# Patient Record
Sex: Female | Born: 2000 | Race: White | Hispanic: No | Marital: Single | Smoking: Former smoker
Health system: Southern US, Community
[De-identification: ages and names within clinical notes are randomized; demographics above are authoritative.]

## PROBLEM LIST (undated history)

## (undated) ENCOUNTER — Inpatient Hospital Stay (HOSPITAL_COMMUNITY): Payer: Self-pay

## (undated) ENCOUNTER — Inpatient Hospital Stay: Payer: Self-pay

## (undated) DIAGNOSIS — Z789 Other specified health status: Secondary | ICD-10-CM

## (undated) HISTORY — DX: Other specified health status: Z78.9

---
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
## 2020-02-18 ENCOUNTER — Other Ambulatory Visit: Payer: Self-pay

## 2020-02-18 DIAGNOSIS — J029 Acute pharyngitis, unspecified: Secondary | ICD-10-CM | POA: Insufficient documentation

## 2020-02-18 LAB — CBC WITH DIFFERENTIAL/PLATELET
Abs Immature Granulocytes: 0.03 10*3/uL (ref 0.00–0.07)
Basophils Absolute: 0 10*3/uL (ref 0.0–0.1)
Basophils Relative: 0 %
Eosinophils Absolute: 0.1 10*3/uL (ref 0.0–0.5)
Eosinophils Relative: 1 %
HCT: 36.3 % (ref 36.0–46.0)
Hemoglobin: 12.1 g/dL (ref 12.0–15.0)
Immature Granulocytes: 0 %
Lymphocytes Relative: 24 %
Lymphs Abs: 2.3 10*3/uL (ref 0.7–4.0)
MCH: 28 pg (ref 26.0–34.0)
MCHC: 33.3 g/dL (ref 30.0–36.0)
MCV: 84 fL (ref 80.0–100.0)
Monocytes Absolute: 0.7 10*3/uL (ref 0.1–1.0)
Monocytes Relative: 7 %
Neutro Abs: 6.5 10*3/uL (ref 1.7–7.7)
Neutrophils Relative %: 68 %
Platelets: 271 10*3/uL (ref 150–400)
RBC: 4.32 MIL/uL (ref 3.87–5.11)
RDW: 12.8 % (ref 11.5–15.5)
WBC: 9.7 10*3/uL (ref 4.0–10.5)
nRBC: 0 % (ref 0.0–0.2)

## 2020-02-18 LAB — BASIC METABOLIC PANEL
Anion gap: 12 (ref 5–15)
BUN: 10 mg/dL (ref 6–20)
CO2: 22 mmol/L (ref 22–32)
Calcium: 9.2 mg/dL (ref 8.9–10.3)
Chloride: 103 mmol/L (ref 98–111)
Creatinine, Ser: 0.7 mg/dL (ref 0.44–1.00)
GFR calc Af Amer: >60 mL/min (ref >60)
GFR calc non Af Amer: >60 mL/min (ref >60)
Glucose, Bld: 95 mg/dL (ref 70–99)
Potassium: 3.4 mmol/L — ABNORMAL LOW (ref 3.5–5.1)
Sodium: 137 mmol/L (ref 135–145)

## 2020-02-18 LAB — GROUP A STREP BY PCR: Group A Strep by PCR: NOT DETECTED

## 2020-02-18 NOTE — ED Triage Notes (Addendum)
Pt states she has a whole in her tonsil. Pt denies pain. Large what appears to be abscess with area of ulceration and sloughing noted to right tonsil. No airway compromise noted.

## 2020-02-19 ENCOUNTER — Emergency Department
Admission: EM | Admit: 2020-02-19 | Discharge: 2020-02-19 | Disposition: A | Discharge status: Home / Self Care | Payer: Self-pay | Attending: Emergency Medicine | Admitting: Emergency Medicine

## 2020-02-19 DIAGNOSIS — J029 Acute pharyngitis, unspecified: Secondary | ICD-10-CM

## 2020-02-19 MED ORDER — AMOXICILLIN 875 MG PO TABS: 875.0000 mg | ORAL_TABLET | Freq: Two times a day (BID) | ORAL | 0 refills | Status: DC

## 2020-02-19 NOTE — ED Provider Notes (Signed)
[facility] Emergency Department Provider Note  ____________________________________________   None    (approximate)  I have reviewed the triage vital signs and the nursing notes.   HISTORY  Chief Complaint Sore Throat   HPI Heather Newton is a 19 y.o. female presents to the ED with complaint of sore throat for several days.  Patient denies any fever or chills.  Patient reports that she is seen what she believes to be an ulcer/hole in her right tonsil.  Patient reports that she has had multiple strep infections in the past.  Currently she rates her pain as a 0/10.       No past medical history on file.  There are no problems to display for this patient.   No past surgical history on file.  Prior to Admission medications   Medication Sig Start Date End Date Taking? Authorizing Provider  amoxicillin (AMOXIL) 875 MG tablet Take 1 tablet (875 mg total) by mouth 2 (two) times daily. 02/19/20   Tommi Rumps, PA-C  dicyclomine (BENTYL) 10 MG capsule Take 1 capsule (10 mg total) by mouth 3 (three) times daily between meals as needed for spasms (abdominal pain). 08/08/16 08/22/16  Myrna Blazer, MD  sucralfate (CARAFATE) 1 g tablet Take 1 tablet (1 g total) by mouth 3 (three) times daily as needed. 07/11/18 07/11/19  Willy Eddy, MD    Allergies Patient has no known allergies.  No family history on file.  Social History Social History   Tobacco Use   Smoking status: Never Smoker   Smokeless tobacco: Never Used  Substance Use Topics   Alcohol use: No   Drug use: Not on file    Review of Systems Constitutional: No fever/chills Eyes: No visual changes. ENT: Positive sore throat.  Negative for changes in taste or smell. Cardiovascular: Denies chest pain. Respiratory: Denies shortness of breath. Gastrointestinal:   No nausea, no vomiting.  No diarrhea. Musculoskeletal: Negative for muscle aches. Skin: Negative for  rash. Neurological: Negative for headaches, focal weakness or numbness. ____________________________________________   PHYSICAL EXAM:  VITAL SIGNS: ED Triage Vitals  Enc Vitals Group     BP 02/18/20 2001 (!) 127/92     Pulse Rate 02/18/20 2001 (!) 103     Resp 02/18/20 2001 16     Temp 02/18/20 2001 99 F (37.2 C)     Temp Source 02/18/20 2001 Oral     SpO2 02/18/20 2001 100 %     Weight 02/18/20 2002 234 lb (106.1 kg)     Height 02/18/20 2002 5\' 9"  (1.753 m)     Head Circumference --      Peak Flow --      Pain Score 02/18/20 2002 0     Pain Loc --      Pain Edu? --      Excl. in GC? --     Constitutional: Alert and oriented. Well appearing and in no acute distress. Eyes: Conjunctivae are normal. PERRL. EOMI. Head: Atraumatic. Nose: No congestion/rhinnorhea. Mouth/Throat: Mucous membranes are moist.  Oropharynx minimal erythema without exudate.  Tonsils appear cryptic on the right with some mild exudate.  Uvula is midline. Neck: No stridor.   Cardiovascular: Normal rate, regular rhythm. Grossly normal heart sounds.  Good peripheral circulation. Respiratory: Normal respiratory effort.  No retractions. Lungs CTAB. Musculoskeletal: Moves upper and lower extremities with any difficulty.  Normal gait was noted. Neurologic:  Normal speech and language. No gross focal neurologic deficits are appreciated.  Skin:  Skin is warm, dry and intact. No rash noted. Psychiatric: Mood and affect are normal. Speech and behavior are normal.  ____________________________________________   LABS (all labs ordered are listed, but only abnormal results are displayed)  Labs Reviewed  BASIC METABOLIC PANEL - Abnormal; Notable for the following components:      Result Value   Potassium 3.4 (*)    All other components within normal limits  GROUP A STREP BY PCR  CBC WITH DIFFERENTIAL/PLATELET     PROCEDURES  Procedure(s) performed (including Critical  Care):  Procedures   ____________________________________________   INITIAL IMPRESSION / ASSESSMENT AND PLAN / ED COURSE  As part of my medical decision making, I reviewed the following data within the electronic MEDICAL RECORD NUMBER Notes from prior ED visits and [place] Controlled Substance Database  Heather Newton was evaluated in Emergency Department on 02/19/2020 for the symptoms described in the history of present illness. She was evaluated in the context of the global COVID-19 pandemic, which necessitated consideration that the patient might be at risk for infection with the SARS-CoV-2 virus that causes COVID-19. Institutional protocols and algorithms that pertain to the evaluation of patients at risk for COVID-19 are in a state of rapid change based on information released by regulatory bodies including the CDC and federal and state organizations. These policies and algorithms were followed during the patient's care in the ED.  19 year old female presents to the ED with complaint of sore throat for several days without history of fever or chills.  Patient reports that she has had strep throat multiple times.  Strep test in the ED today is negative.  Lab work is essentially within normal limits with the exception of some mild hyponatremia as patient's potassium is 3.4.  This was discussed with her and she will try to increase foods that are rich in potassium.  She was made aware that she does not need to take medication for this.  She is to follow-up with Dr. Willeen Cass who is on-call for ENT as she wishes to get her tonsils removed in the future.  A prescription for amoxicillin 875 twice daily for 10 days was sent to her pharmacy.  She is instructed to take Tylenol or ibuprofen as needed for throat pain. ____________________________________________   FINAL CLINICAL IMPRESSION(S) / ED DIAGNOSES  Final diagnoses:  Acute pharyngitis, unspecified etiology     ED Discharge Orders         Ordered     amoxicillin (AMOXIL) 875 MG tablet  2 times daily        02/19/20 0254           Note:  This document was prepared using Dragon voice recognition software and may include unintentional dictation errors.    Tommi Rumps, PA-C 02/19/20 2706    Sharyn Creamer, MD 02/19/20 1015

## 2020-02-19 NOTE — Discharge Instructions (Signed)
Follow up Dr. Willeen Cass if any continued problems or concerns.  Take amoxil until completely finished.  Tylenol or ibuprofen if needed for pain or fever.  Increase fluids.

## 2021-12-09 ENCOUNTER — Other Ambulatory Visit: Payer: Self-pay

## 2021-12-09 ENCOUNTER — Emergency Department
Admission: EM | Admit: 2021-12-09 | Discharge: 2021-12-09 | Disposition: A | Discharge status: Home / Self Care | Payer: Self-pay | Attending: Student in an Organized Health Care Education/Training Program | Admitting: Student in an Organized Health Care Education/Training Program

## 2021-12-09 DIAGNOSIS — R1011 Right upper quadrant pain: Secondary | ICD-10-CM | POA: Insufficient documentation

## 2021-12-09 DIAGNOSIS — M545 Low back pain, unspecified: Secondary | ICD-10-CM | POA: Insufficient documentation

## 2021-12-09 DIAGNOSIS — N912 Amenorrhea, unspecified: Secondary | ICD-10-CM | POA: Insufficient documentation

## 2021-12-09 DIAGNOSIS — R1031 Right lower quadrant pain: Secondary | ICD-10-CM | POA: Insufficient documentation

## 2021-12-09 LAB — COMPREHENSIVE METABOLIC PANEL
ALT: 16 U/L (ref 0–44)
AST: 14 U/L — ABNORMAL LOW (ref 15–41)
Albumin: 3.8 g/dL (ref 3.5–5.0)
Alkaline Phosphatase: 49 U/L (ref 38–126)
Anion gap: 8 (ref 5–15)
BUN: 10 mg/dL (ref 6–20)
CO2: 23 mmol/L (ref 22–32)
Calcium: 9.5 mg/dL (ref 8.9–10.3)
Chloride: 107 mmol/L (ref 98–111)
Creatinine, Ser: 0.74 mg/dL (ref 0.44–1.00)
GFR, Estimated: >60 mL/min (ref >60)
Glucose, Bld: 105 mg/dL — ABNORMAL HIGH (ref 70–99)
Potassium: 3.8 mmol/L (ref 3.5–5.1)
Sodium: 138 mmol/L (ref 135–145)
Total Bilirubin: 0.2 mg/dL — ABNORMAL LOW (ref 0.3–1.2)
Total Protein: 8.3 g/dL — ABNORMAL HIGH (ref 6.5–8.1)

## 2021-12-09 LAB — LIPASE, BLOOD: Lipase: 32 U/L (ref 11–51)

## 2021-12-09 LAB — URINALYSIS, ROUTINE W REFLEX MICROSCOPIC
Bilirubin Urine: NEGATIVE
Glucose, UA: NEGATIVE mg/dL
Hgb urine dipstick: NEGATIVE
Ketones, ur: NEGATIVE mg/dL
Leukocytes,Ua: NEGATIVE
Nitrite: NEGATIVE
Protein, ur: NEGATIVE mg/dL
Specific Gravity, Urine: 1.03 (ref 1.005–1.030)
pH: 5 (ref 5.0–8.0)

## 2021-12-09 LAB — CBC
HCT: 40.3 % (ref 36.0–46.0)
Hemoglobin: 12.8 g/dL (ref 12.0–15.0)
MCH: 26.9 pg (ref 26.0–34.0)
MCHC: 31.8 g/dL (ref 30.0–36.0)
MCV: 84.7 fL (ref 80.0–100.0)
Platelets: 315 10*3/uL (ref 150–400)
RBC: 4.76 MIL/uL (ref 3.87–5.11)
RDW: 13.6 % (ref 11.5–15.5)
WBC: 6.7 10*3/uL (ref 4.0–10.5)
nRBC: 0 % (ref 0.0–0.2)

## 2021-12-09 LAB — POC URINE PREG, ED: Preg Test, Ur: NEGATIVE

## 2021-12-09 NOTE — Discharge Instructions (Signed)
Please call GYN physician tomorrow to schedule follow-up appointment.  Return to the ER for any increasing pain, fevers, nausea, vomiting or any urgent changes in her health.  You may alternate Tylenol 1000 mg along with ibuprofen 800 mg every 6 hours as needed for pain.

## 2021-12-09 NOTE — ED Provider Notes (Cosign Needed)
[facility] EMERGENCY DEPARTMENT Provider Note   CSN: [id number] Arrival date & time: 12/09/21  1436     History  Chief Complaint  Patient presents with   Abdominal Pain    Heather Newton is a 21 y.o. female with no known past medical history or surgical history presents to the emergency department for evaluation of 3 weeks of right upper and lower abdominal pain with right lower back pain.  Pain is sharp and lasts 10 minutes.  Symptoms come and go with no known exacerbating or improving factors.  She has not had any medications for pain.  Patient states she has not had a menstrual period in 4 months.  She denies any birth control, history of amenorrhea.  No nausea, vomiting, fevers, urinary symptoms or vaginal bleeding.  HPI     Home Medications Prior to Admission medications   Medication Sig Start Date End Date Taking? Authorizing Provider  amoxicillin (AMOXIL) 875 MG tablet Take 1 tablet (875 mg total) by mouth 2 (two) times daily. 02/19/20   Tommi Rumps, PA-C  dicyclomine (BENTYL) 10 MG capsule Take 1 capsule (10 mg total) by mouth 3 (three) times daily between meals as needed for spasms (abdominal pain). 08/08/16 08/22/16  Myrna Blazer, MD  sucralfate (CARAFATE) 1 g tablet Take 1 tablet (1 g total) by mouth 3 (three) times daily as needed. 07/11/18 07/11/19  Willy Eddy, MD      Allergies    Patient has no known allergies.    Review of Systems   Review of Systems  Physical Exam Updated Vital Signs BP 120/67 (BP Location: Left Arm)   Pulse 80   Temp 99 F (37.2 C) (Oral)   Resp 20   Ht 5\' 9"  (1.753 m)   Wt 122.5 kg   SpO2 97%   BMI 39.87 kg/m  Physical Exam Constitutional:      General: She is not in acute distress.    Appearance: She is well-developed. She is obese. She is not toxic-appearing.  HENT:     Head: Normocephalic and atraumatic.  Eyes:     Conjunctiva/sclera: Conjunctivae normal.  Cardiovascular:     Rate  and Rhythm: Normal rate and regular rhythm.     Pulses: Normal pulses.     Heart sounds: Normal heart sounds.  Pulmonary:     Effort: Pulmonary effort is normal. No respiratory distress.  Abdominal:     General: Bowel sounds are normal.     Palpations: Abdomen is soft. There is no mass.     Tenderness: There is abdominal tenderness (RUQ tenderness, mild.  Mild right lower quadrant tenderness). There is right CVA tenderness. There is no left CVA tenderness or rebound.  Musculoskeletal:        General: Normal range of motion.     Cervical back: Normal range of motion.  Skin:    General: Skin is warm.     Findings: No rash.  Neurological:     General: No focal deficit present.     Mental Status: She is alert and oriented to person, place, and time. Mental status is at baseline.  Psychiatric:        Behavior: Behavior normal.        Thought Content: Thought content normal.     ED Results / Procedures / Treatments   Labs (all labs ordered are listed, but only abnormal results are displayed) Labs Reviewed  COMPREHENSIVE METABOLIC PANEL - Abnormal; Notable for the following components:  Result Value   Glucose, Bld 105 (*)    Total Protein 8.3 (*)    AST 14 (*)    Total Bilirubin 0.2 (*)    All other components within normal limits  URINALYSIS, ROUTINE W REFLEX MICROSCOPIC - Abnormal; Notable for the following components:   Color, Urine YELLOW (*)    APPearance HAZY (*)    All other components within normal limits  LIPASE, BLOOD  CBC  POC URINE PREG, ED    EKG None  Radiology No results found.  Procedures Procedures    Medications Ordered in ED Medications - No data to display  ED Course/ Medical Decision Making/ A&P                           Medical Decision Making Amount and/or Complexity of Data Reviewed Labs: ordered.   21 year old female with secondary amenorrhea, without menstrual period x4 months.  Having some intermittent pain x3 weeks along the  right lower and right upper quadrant but only last about 10 minutes not increased with eating, no nausea vomiting diarrhea or fevers.  She has had a negative pregnancy test.  CBC and CMP within normal limits showing no signs of any hepatobiliary disease or infection.  Urinalysis negative for any infectious process.  Patient appears well, comfortable in no distress.  Her vital signs are stable.  Recommend patient follow-up with GYN physician for work-up for her amenorrhea.    Final Clinical Impression(s) / ED Diagnoses Final diagnoses:  Right upper quadrant abdominal pain  Acute right-sided low back pain without sciatica  Amenorrhea    Rx / DC Orders ED Discharge Orders     None         Evon Slack, PA-C 12/09/21 1649

## 2021-12-09 NOTE — ED Triage Notes (Signed)
Pt c/o intermittent RLQ pain radiating into the back for the past 3 weeks, states she has not had a period in the past 4 months and has had multiple negative pregnancy test.

## 2021-12-14 ENCOUNTER — Encounter: Payer: Self-pay | Admitting: Obstetrics and Gynecology

## 2021-12-23 ENCOUNTER — Encounter: Payer: Self-pay | Admitting: Obstetrics & Gynecology

## 2022-02-16 ENCOUNTER — Encounter: Payer: Self-pay | Admitting: Emergency Medicine

## 2022-02-16 ENCOUNTER — Emergency Department
Admission: EM | Admit: 2022-02-16 | Discharge: 2022-02-16 | Disposition: A | Discharge status: Home / Self Care | Payer: Self-pay | Attending: Emergency Medicine | Admitting: Emergency Medicine

## 2022-02-16 ENCOUNTER — Other Ambulatory Visit: Payer: Self-pay

## 2022-02-16 DIAGNOSIS — Z0279 Encounter for issue of other medical certificate: Secondary | ICD-10-CM | POA: Insufficient documentation

## 2022-02-16 DIAGNOSIS — Z0289 Encounter for other administrative examinations: Secondary | ICD-10-CM

## 2022-02-16 NOTE — ED Triage Notes (Signed)
Pt states that she missed work due to her menstrual cycle and needs a work note. Pt denies any complaints at this time.

## 2022-02-16 NOTE — ED Notes (Signed)
Dc instructions and work note provided to pt. No questions at this time.

## 2022-02-16 NOTE — ED Provider Notes (Signed)
   [facility] Provider Note    Event Date/Time   First MD Initiated Contact with Patient 02/16/22 1401     (approximate)   History   work note   HPI  Heather Newton is a 21 y.o. female presents to the emergency department to receive a note that states that she is able to return to work.  She missed work today because she started her menstrual cycle and had a significant amount of pain.  This pain has resolved and she denies medical complaints at this time.  History reviewed. No pertinent past medical history.   Physical Exam   Triage Vital Signs: ED Triage Vitals  Enc Vitals Group     BP 02/16/22 1341 124/74     Pulse Rate 02/16/22 1341 83     Resp 02/16/22 1341 16     Temp 02/16/22 1341 97.7 F (36.5 C)     Temp Source 02/16/22 1341 Oral     SpO2 02/16/22 1341 99 %     Weight --      Height --      Head Circumference --      Peak Flow --      Pain Score 02/16/22 1335 0     Pain Loc --      Pain Edu? --      Excl. in GC? --     Most recent vital signs: Vitals:   02/16/22 1341  BP: 124/74  Pulse: 83  Resp: 16  Temp: 97.7 F (36.5 C)  SpO2: 99%    General: Awake, no distress.  CV:  Good peripheral perfusion.  Resp:  Normal effort.  Abd:  No distention.  Other:    ED Results / Procedures / Treatments   Labs (all labs ordered are listed, but only abnormal results are displayed) Labs Reviewed - No data to display   EKG     RADIOLOGY    I have independently reviewed and interpreted imaging as well as reviewed report from radiology.  PROCEDURES:  Critical Care performed: No  Procedures   MEDICATIONS ORDERED IN ED:  Medications - No data to display   IMPRESSION / MDM / ASSESSMENT AND PLAN / ED COURSE   I reviewed the triage vital signs and the nursing notes.  Differential diagnosis includes, but is not limited to: n/a  Patient's presentation is most consistent with acute, uncomplicated  illness.  21 year old female presenting to the emergency department requesting a work excuse.  She has no current medical complaints.  See HPI.      FINAL CLINICAL IMPRESSION(S) / ED DIAGNOSES   Final diagnoses:  Encounter to obtain excuse from work     Rx / DC Orders   ED Discharge Orders     None        Note:  This document was prepared using Conservation officer, historic buildings and may include unintentional dictation errors.   Chinita Pester, FNP 02/16/22 1407    Georga Hacking, MD 02/16/22 [phone]

## 2022-03-13 DIAGNOSIS — H538 Other visual disturbances: Secondary | ICD-10-CM | POA: Insufficient documentation

## 2022-03-13 DIAGNOSIS — R519 Headache, unspecified: Secondary | ICD-10-CM | POA: Insufficient documentation

## 2022-03-13 DIAGNOSIS — H02401 Unspecified ptosis of right eyelid: Secondary | ICD-10-CM | POA: Insufficient documentation

## 2022-03-13 NOTE — ED Triage Notes (Addendum)
Pt states "there is something wrong with my eye". Pt states has been present for five days. Pt states right eye "white vision" tonight before placing eye drops in eye.Marland Kitchen Pt with slight ptosis noted to right eye. Pt states now has blurred vision in right eye. Speech clear. Pt also complains of pain in right eye.

## 2022-03-14 ENCOUNTER — Other Ambulatory Visit: Payer: Self-pay

## 2022-03-14 ENCOUNTER — Encounter: Payer: Self-pay | Admitting: Radiology

## 2022-03-14 ENCOUNTER — Emergency Department: Payer: Self-pay

## 2022-03-14 ENCOUNTER — Emergency Department
Admission: EM | Admit: 2022-03-14 | Discharge: 2022-03-14 | Disposition: A | Discharge status: Home / Self Care | Payer: Self-pay | Attending: Emergency Medicine | Admitting: Emergency Medicine

## 2022-03-14 DIAGNOSIS — H538 Other visual disturbances: Secondary | ICD-10-CM

## 2022-03-14 DIAGNOSIS — H02401 Unspecified ptosis of right eyelid: Secondary | ICD-10-CM

## 2022-03-14 DIAGNOSIS — R519 Headache, unspecified: Secondary | ICD-10-CM

## 2022-03-14 LAB — BASIC METABOLIC PANEL
Anion gap: 4 — ABNORMAL LOW (ref 5–15)
BUN: 11 mg/dL (ref 6–20)
CO2: 27 mmol/L (ref 22–32)
Calcium: 9.3 mg/dL (ref 8.9–10.3)
Chloride: 108 mmol/L (ref 98–111)
Creatinine, Ser: 0.77 mg/dL (ref 0.44–1.00)
GFR, Estimated: >60 mL/min (ref >60)
Glucose, Bld: 114 mg/dL — ABNORMAL HIGH (ref 70–99)
Potassium: 3.5 mmol/L (ref 3.5–5.1)
Sodium: 139 mmol/L (ref 135–145)

## 2022-03-14 LAB — CBC WITH DIFFERENTIAL/PLATELET
Abs Immature Granulocytes: 0.03 10*3/uL (ref 0.00–0.07)
Basophils Absolute: 0 10*3/uL (ref 0.0–0.1)
Basophils Relative: 0 %
Eosinophils Absolute: 0.2 10*3/uL (ref 0.0–0.5)
Eosinophils Relative: 2 %
HCT: 37.9 % (ref 36.0–46.0)
Hemoglobin: 12.1 g/dL (ref 12.0–15.0)
Immature Granulocytes: 0 %
Lymphocytes Relative: 34 %
Lymphs Abs: 3.3 10*3/uL (ref 0.7–4.0)
MCH: 27.1 pg (ref 26.0–34.0)
MCHC: 31.9 g/dL (ref 30.0–36.0)
MCV: 84.8 fL (ref 80.0–100.0)
Monocytes Absolute: 0.9 10*3/uL (ref 0.1–1.0)
Monocytes Relative: 9 %
Neutro Abs: 5.1 10*3/uL (ref 1.7–7.7)
Neutrophils Relative %: 55 %
Platelets: 316 10*3/uL (ref 150–400)
RBC: 4.47 MIL/uL (ref 3.87–5.11)
RDW: 13.1 % (ref 11.5–15.5)
WBC: 9.5 10*3/uL (ref 4.0–10.5)
nRBC: 0 % (ref 0.0–0.2)

## 2022-03-14 LAB — HCG, QUANTITATIVE, PREGNANCY: hCG, Beta Chain, Quant, S: <1 m[IU]/mL (ref <5)

## 2022-03-14 MED ORDER — GADOPICLENOL 0.5 MMOL/ML IV SOLN
5.0000 mL | Freq: Once | INTRAVENOUS | Status: AC | PRN
  Administered 2022-03-14: 5 mL via INTRAVENOUS

## 2022-03-14 NOTE — Discharge Instructions (Addendum)
Return to the ER for recurrent or worsening symptoms, persistent vomiting, difficulty breathing or other concerns. 

## 2022-03-14 NOTE — ED Notes (Signed)
NAD noted at time of D/C. Pt denies questions or concerns. Pt ambulatory to the lobby at this time. Verbal consent for D/C obtained at this time.  

## 2022-03-14 NOTE — ED Provider Notes (Signed)
[facility] Provider Note    Event Date/Time   First MD Initiated Contact with Patient 03/14/22 0005     (approximate)   History   Eye Problem   HPI  Heather Newton is a 21 y.o. female who presents to the ED from home with a chief complaint of right eye problem.  Patient does not wear corrective lenses or contact lenses.  Reports symptoms ongoing for 5 days.  Reports "white blurry vision" to right eye.  Denies floaters or foreign body sensation.  Denies photophobia.  Placed some over-the-counter eyedrops "for infection" prior to arrival.  Presents tonight because her father told her to come although she has noted progressive worsening of her symptoms.  Also endorses headache.  No history of migraines. Denies trauma/injury/foreign body sensation. Denies tearing from eye. Denies facial droop, drooling, abnormal sensation, slurred speech, extremity weakness/numbness/tingling.     Past Medical History  No past medical history on file.   Active Problem List  There are no problems to display for this patient.    Past Surgical History  No past surgical history on file.   Home Medications   Prior to Admission medications   Medication Sig Start Date End Date Taking? Authorizing Provider  amoxicillin (AMOXIL) 875 MG tablet Take 1 tablet (875 mg total) by mouth 2 (two) times daily. 02/19/20   Tommi Rumps, PA-C  dicyclomine (BENTYL) 10 MG capsule Take 1 capsule (10 mg total) by mouth 3 (three) times daily between meals as needed for spasms (abdominal pain). 08/08/16 08/22/16  Myrna Blazer, MD  sucralfate (CARAFATE) 1 g tablet Take 1 tablet (1 g total) by mouth 3 (three) times daily as needed. 07/11/18 07/11/19  Willy Eddy, MD     Allergies  Patient has no known allergies.   Family History  No FH of MS   Physical Exam  Triage Vital Signs: ED Triage Vitals  Enc Vitals Group     BP 03/14/22 0002 (!) 114/58     Pulse Rate 03/14/22  0002 96     Resp 03/14/22 0002 20     Temp 03/14/22 0002 98.6 F (37 C)     Temp Source 03/14/22 0002 Oral     SpO2 03/14/22 0002 98 %     Weight 03/14/22 0001 243 lb (110.2 kg)     Height 03/14/22 0001 5\' 9"  (1.753 m)     Head Circumference --      Peak Flow --      Pain Score 03/14/22 0000 8     Pain Loc --      Pain Edu? --      Excl. in [place]? --     Updated Vital Signs: BP 122/67 (BP Location: Right Arm)   Pulse 88   Temp 98.6 F (37 C) (Oral)   Resp 18   Ht 5\' 9"  (1.753 m)   Wt 110.2 kg   LMP 03/07/2022   SpO2 99%   BMI 35.88 kg/m    General: Awake, no distress.  CV:  Good peripheral perfusion.  Resp:  Normal effort.  Abd:  No distention.  Other:  Right ptosis noted. Right eyelid slightly weaker than left.  PERRL.  EOMI with pain.  No miosis noted.  Funduscopy grossly within normal limits.  No papilledema.  No photophobia.  No carotid bruits.  Supple neck without meningismus.  Alert and oriented x3.  CN II-XII except for CN III grossly intact.  5/5 motor strength and sensation  all extremities. MAEx4.   ED Results / Procedures / Treatments  Labs (all labs ordered are listed, but only abnormal results are displayed) Labs Reviewed  BASIC METABOLIC PANEL - Abnormal; Notable for the following components:      Result Value   Glucose, Bld 114 (*)    Anion gap 4 (*)    All other components within normal limits  CBC WITH DIFFERENTIAL/PLATELET  HCG, QUANTITATIVE, PREGNANCY     EKG  None   RADIOLOGY I have independently visualized and interpreted patient's MRI/MRA as well as noted the radiology interpretation:  MRI brain: Normal  MRA: Normal  Official radiology report(s): MR Brain W and Wo Contrast  Result Date: 03/14/2022 CLINICAL DATA:  Initial evaluation for acute headache, right sided ptosis. EXAM: MRI HEAD WITHOUT AND WITH CONTRAST MRA HEAD WITHOUT CONTRAST TECHNIQUE: Multiplanar, multi-echo pulse sequences of the brain and surrounding structures were  acquired without and with intravenous contrast. Angiographic images of the Circle of Willis were acquired using MRA technique without intravenous contrast. CONTRAST:  5 cc of Vueway COMPARISON:  None Available. FINDINGS: MRI HEAD FINDINGS Brain: Cerebral volume within normal limits for age. No focal parenchymal signal abnormality. No abnormal foci of restricted diffusion to suggest acute or subacute ischemia. Gray-white matter differentiation well maintained. No encephalomalacia to suggest chronic cortical infarction or other insult. No foci of susceptibility artifact indicative of acute or chronic intracranial blood products. No mass lesion, midline shift or mass effect. Ventricles normal in size and morphology without hydrocephalus. No extra-axial fluid collection. Pituitary gland and suprasellar region within normal limits. No abnormal enhancement. 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 Vascular: Major intracranial vascular flow voids are well maintained. Skull and upper cervical spine: Craniocervical junction within normal limits. Visualized upper cervical spine demonstrates no significant finding. Bone marrow signal intensity within normal limits. No scalp soft tissue abnormality. Sinuses/Orbits: Globes and orbital soft tissues are within normal limits. Paranasal sinuses are largely clear. No significant mastoid effusion. Other: None. MRA HEAD FINDINGS Anterior circulation: Visualized distal cervical segments of the internal carotid arteries are widely patent with antegrade flow. Petrous, cavernous, and supraclinoid segments widely patent without stenosis or other abnormality. A1 segments patent bilaterally. Normal anterior communicating artery complex. Anterior cerebral arteries widely patent without stenosis. No M1 stenosis or occlusion. Normal MCA bifurcations. No proximal MCA branch occlusion or stenosis. Distal MCA branches perfused and symmetric. Posterior circulation:  Vertebral arteries are code dominant and widely patent to the vertebrobasilar junction. Both PICA patent. Basilar widely patent to its distal aspect without stenosis. Superior cerebellar arteries patent bilaterally. Right PCA primarily supplied via the basilar, although a small right posterior communicating artery is noted. Dominant fetal type origin of the left PCA. Both PCAs widely patent to their distal aspects without stenosis. Anatomic variants: Fetal type origin of the left PCA. No intracranial aneurysm or other vascular malformation. IMPRESSION: 1. Normal brain MRI, with and without contrast. No acute intracranial abnormality or findings to explain patient's symptoms. 2. Normal intracranial MRA. No intracranial aneurysm or other vascular abnormality. Electronically Signed   By: Jeannine Boga M.D.   On: 03/14/2022 03:01   MR ANGIO HEAD WO CONTRAST  Result Date: 03/14/2022 CLINICAL DATA:  Initial evaluation for acute headache, right sided ptosis. EXAM: MRI HEAD WITHOUT AND WITH CONTRAST MRA HEAD WITHOUT CONTRAST TECHNIQUE: Multiplanar, multi-echo pulse sequences of the brain and surrounding structures were acquired without and with intravenous contrast. Angiographic images of the Circle of Willis  were acquired using MRA technique without intravenous contrast. CONTRAST:  5 cc of Vueway COMPARISON:  None Available. FINDINGS: MRI HEAD FINDINGS Brain: Cerebral volume within normal limits for age. No focal parenchymal signal abnormality. No abnormal foci of restricted diffusion to suggest acute or subacute ischemia. Gray-white matter differentiation well maintained. No encephalomalacia to suggest chronic cortical infarction or other insult. No foci of susceptibility artifact indicative of acute or chronic intracranial blood products. No mass lesion, midline shift or mass effect. Ventricles normal in size and morphology without hydrocephalus. No extra-axial fluid collection. Pituitary gland and suprasellar  region within normal limits. No abnormal enhancement. 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 Vascular: Major intracranial vascular flow voids are well maintained. Skull and upper cervical spine: Craniocervical junction within normal limits. Visualized upper cervical spine demonstrates no significant finding. Bone marrow signal intensity within normal limits. No scalp soft tissue abnormality. Sinuses/Orbits: Globes and orbital soft tissues are within normal limits. Paranasal sinuses are largely clear. No significant mastoid effusion. Other: None. MRA HEAD FINDINGS Anterior circulation: Visualized distal cervical segments of the internal carotid arteries are widely patent with antegrade flow. Petrous, cavernous, and supraclinoid segments widely patent without stenosis or other abnormality. A1 segments patent bilaterally. Normal anterior communicating artery complex. Anterior cerebral arteries widely patent without stenosis. No M1 stenosis or occlusion. Normal MCA bifurcations. No proximal MCA branch occlusion or stenosis. Distal MCA branches perfused and symmetric. Posterior circulation: Vertebral arteries are code dominant and widely patent to the vertebrobasilar junction. Both PICA patent. Basilar widely patent to its distal aspect without stenosis. Superior cerebellar arteries patent bilaterally. Right PCA primarily supplied via the basilar, although a small right posterior communicating artery is noted. Dominant fetal type origin of the left PCA. Both PCAs widely patent to their distal aspects without stenosis. Anatomic variants: Fetal type origin of the left PCA. No intracranial aneurysm or other vascular malformation. IMPRESSION: 1. Normal brain MRI, with and without contrast. No acute intracranial abnormality or findings to explain patient's symptoms. 2. Normal intracranial MRA. No intracranial aneurysm or other vascular abnormality. Electronically Signed   By: Rise Mu M.D.   On: 03/14/2022 03:01     PROCEDURES:  Critical Care performed: No  Procedures   MEDICATIONS ORDERED IN ED: Medications  gadopiclenol (VUEWAY) 0.5 MMOL/ML solution 5 mL (5 mLs Intravenous Contrast Given 03/14/22 0129)     IMPRESSION / MDM / ASSESSMENT AND PLAN / ED COURSE  I reviewed the triage vital signs and the nursing notes.                             21 year old otherwise healthy female presenting with a 5-day history of right vision changes; ptosis noted on exam.  Differential diagnosis includes but is not limited to optic neuritis, complex migraine, cranial nerve III palsy, cerebral aneurysm, Horner syndrome, idiopathic intracranial hypertension etc.  I have personally reviewed patient's records and note mostly ER visits for minor care issues.  Patient had a recent GYN visit for dysfunctional uterine bleeding on 12/16/2021.  Patient's presentation is most consistent with acute presentation with potential threat to life or bodily function.  Will obtain visual acuity, imaging to evaluate cause of right ptosis.  Will consult with radiologist to determine best imaging study.  Clinical Course as of 03/14/22 0322  Mon Mar 14, 2022  0032 Appreciate Dr. Phill Myron from radiology is advised on imaging studies.  We will proceed  with MRI brain with and without as well as MRA brain assuming patient is able to remove all of her ear piercings. [JS]  0214 VA: OD 20/25 OS 20/20 OU 20/20 [JS]  0258 Patient resting in no acute distress; awaiting MRI results. [JS]  [id number] Updated patient on negative MRI/MRA results. Neck remains supple without meningismus.  Unclear etiology of patient's symptoms.  Not convinced her symptoms are secondary to idiopathic intracranial hypertension as it should not cause ptosis and funduscopy is clear.  We did discuss risk/benefits of performing LP but she would rather see neurology first.  Additionally she will call the ophthalmology office this morning  for follow up. Strict return precautions given. Patient verbalizes understanding and agrees with plan of care. [JS]    Clinical Course User Index [JS] Irean Hong, MD     FINAL CLINICAL IMPRESSION(S) / ED DIAGNOSES   Final diagnoses:  Blurry vision, right eye  Acute nonintractable headache, unspecified headache type  Ptosis of right eyelid     Rx / DC Orders   ED Discharge Orders     None        Note:  This document was prepared using Dragon voice recognition software and may include unintentional dictation errors.   Irean Hong, MD 03/14/22 [phone]

## 2022-03-14 NOTE — ED Notes (Signed)
Pt to MRI via wheelchair.

## 2022-03-14 NOTE — ED Notes (Addendum)
Pt back from MRI - Visual acuity screening:   R 20/25 L 20/20 Both 20/20

## 2022-05-19 ENCOUNTER — Ambulatory Visit (LOCAL_COMMUNITY_HEALTH_CENTER): Payer: Self-pay

## 2022-05-19 DIAGNOSIS — Z3202 Encounter for pregnancy test, result negative: Secondary | ICD-10-CM

## 2022-05-19 LAB — PREGNANCY, URINE: Preg Test, Ur: NEGATIVE

## 2022-05-19 NOTE — Progress Notes (Signed)
UPT negative.  States LMP 04/08/22 and has had 5 positive PTs and 1 negative PT at home.  States unprotected sex for the past month.  Explained to her that today's PT may not detect pregnancy if unprotected sex in the last 2 weeks.  Advised use condoms until next menses if pregnancy not desired; otherwise repeat PT in a couple of weeks.  Pt sates she does not want to use birth control.  Negative pregnancy packet given. Discussed use of condoms and informed pt of Family Planning services here at [facility] if interested.    Unable to stay to complete visit because sister with her had to be at work.  Cherlynn Polo, RN

## 2022-06-22 ENCOUNTER — Emergency Department
Admission: EM | Admit: 2022-06-22 | Discharge: 2022-06-22 | Disposition: A | Discharge status: Home / Self Care | Payer: Self-pay | Attending: Emergency Medicine | Admitting: Emergency Medicine

## 2022-06-22 ENCOUNTER — Other Ambulatory Visit: Payer: Self-pay

## 2022-06-22 DIAGNOSIS — J101 Influenza due to other identified influenza virus with other respiratory manifestations: Secondary | ICD-10-CM

## 2022-06-22 DIAGNOSIS — Z1152 Encounter for screening for COVID-19: Secondary | ICD-10-CM | POA: Insufficient documentation

## 2022-06-22 LAB — RESP PANEL BY RT-PCR (RSV, FLU A&B, COVID)  RVPGX2
Influenza A by PCR: POSITIVE — AB
Influenza B by PCR: NEGATIVE
Resp Syncytial Virus by PCR: NEGATIVE
SARS Coronavirus 2 by RT PCR: NEGATIVE

## 2022-06-22 NOTE — ED Provider Notes (Signed)
   [facility] Provider Note    Event Date/Time   First MD Initiated Contact with Patient 06/22/22 1629     (approximate)   History   No chief complaint on file.   HPI Heather Newton is a 22 y.o. female with no significant past medical history presents emergency department with cough, congestion, body aches and headache.  Symptoms for 4 days.  No chest pain or shortness of breath      Physical Exam   Triage Vital Signs: ED Triage Vitals  Enc Vitals Group     BP 06/22/22 1459 110/81     Pulse Rate 06/22/22 1459 92     Resp 06/22/22 1459 16     Temp 06/22/22 1459 98.1 F (36.7 C)     Temp Source 06/22/22 1459 Oral     SpO2 06/22/22 1459 99 %     Weight 06/22/22 1458 242 lb 15.2 oz (110.2 kg)     Height 06/22/22 1458 5\' 9"  (1.753 m)     Head Circumference --      Peak Flow --      Pain Score 06/22/22 1458 0     Pain Loc --      Pain Edu? --      Excl. in [place]? --     Most recent vital signs: Vitals:   06/22/22 1459  BP: 110/81  Pulse: 92  Resp: 16  Temp: 98.1 F (36.7 C)  SpO2: 99%     General: Awake, no distress.   CV:  Good peripheral perfusion. regular rate and  rhythm Resp:  Normal effort. Lungs CTA Abd:  No distention.   Other:      ED Results / Procedures / Treatments   Labs (all labs ordered are listed, but only abnormal results are displayed) Labs Reviewed  RESP PANEL BY RT-PCR (RSV, FLU A&B, COVID)  RVPGX2 - Abnormal; Notable for the following components:      Result Value   Influenza A by PCR POSITIVE (*)    All other components within normal limits     EKG     RADIOLOGY     PROCEDURES:   Procedures   MEDICATIONS ORDERED IN ED: Medications - No data to display   IMPRESSION / MDM / [place] / ED COURSE  I reviewed the triage vital signs and the nursing notes.                              Differential diagnosis includes, but is not limited to, COVID, influenza, RSV, acute  bronchitis  Patient's presentation is most consistent with acute complicated illness / injury requiring diagnostic workup.   Respiratory panel is positive for influenza A  I did explain the findings to the patient.  She is to use over-the-counter measures, she is out of the window for Tamiflu.  She is in agreement treatment plan.  Discharged stable condition.      FINAL CLINICAL IMPRESSION(S) / ED DIAGNOSES   Final diagnoses:  Influenza A     Rx / DC Orders   ED Discharge Orders     None        Note:  This document was prepared using Dragon voice recognition software and may include unintentional dictation errors.    Versie Starks, PA-C 06/22/22 1741    Harvest Dark, MD 06/22/22 2029

## 2022-06-22 NOTE — ED Triage Notes (Signed)
C/O fever, difficulty breathing, nausea x 4 days  Also chills and decreased appetite  AAOx3.  Skin warm and dry. NAD

## 2022-06-22 NOTE — Discharge Instructions (Signed)
Follow-up with your regular doctor improving 3 days, return if worsening.  Use over-the-counter TheraFlu, Tylenol and ibuprofen.  Delsym or Mucinex for cough

## 2022-06-29 ENCOUNTER — Ambulatory Visit: Payer: Self-pay | Admitting: Physician Assistant

## 2022-06-29 NOTE — Progress Notes (Deleted)
New patient visit   Patient: Heather Newton   DOB: 06/04/2001   22 y.o. Female  MRN: [id number] Visit Date: 06/29/2022  Today's healthcare provider: Mardene Speak, PA-C   No chief complaint on file.  Subjective    Heather Newton is a 22 y.o. female who presents today as a new patient to establish care.  HPI  Obesity: Patient complains of obesity. Patient cites {motive:16538} as reasons for wanting to lose weight.  Obesity History Weight in late teens: *** lb. Period of greatest weight gain: *** lb during {age at onset:[age]} Lowest adult weight: *** Highest adult weight: *** Amount of time at present weight: *** lb.   History of Weight Loss Efforts Greatest amount of weight lost: *** lb over *** months Amount of time that loss was maintained: *** months Circumstances associated with regain of weight: *** Successful weight loss techniques attempted: {obesity treatment:12513} Unsuccessful weight loss techniques attempted: {obesity treatment:12513}  Current Exercise Habits {exercise types:16438}  Current Eating Habits Number of regular meals per day: {0-10:33138} Number of snacking episodes per day: {0-10:33138} Who shops for food? {companion:5061::"patient"} Who prepares food? {companion:5061::"patient"} Who eats with patient? {companion:5061::"patient"} Binge behavior?: {yes***/no:17258} Purge behavior? {yes***/no:17258} Anorexic behavior? {yes***/no:17258} Eating precipitated by stress? {yes***/no:17258} Guilt feelings associated with eating? {yes***/no:17258}  Other Potential Contributing Factors Use of alcohol: average *** drinks/week Use of medications that may cause weight gain {meds:16753} History of past abuse? {abuse type:16542} Psych History: {ros - neuro psych list:32387} Comorbidities: {assd:16751}   No past medical history on file. No past surgical history on file. No family status information on file.   No family history on file. Social History    Socioeconomic History   Marital status: Single    Spouse name: Not on file   Number of children: Not on file   Years of education: Not on file   Highest education level: Not on file  Occupational History   Not on file  Tobacco Use   Smoking status: Never   Smokeless tobacco: Never  Substance and Sexual Activity   Alcohol use: No   Drug use: Not on file   Sexual activity: Not on file  Other Topics Concern   Not on file  Social History Narrative   Not on file   Social Determinants of Health   Financial Resource Strain: Not on file  Food Insecurity: Not on file  Transportation Needs: Not on file  Physical Activity: Not on file  Stress: Not on file  Social Connections: Not on file   No outpatient medications prior to visit.   No facility-administered medications prior to visit.   No Known Allergies   There is no immunization history on file for this patient.  Health Maintenance  Topic Date Due   COVID-19 Vaccine (1) Never done   HPV VACCINES (1 - 2-dose series) Never done   HIV Screening  Never done   Hepatitis C Screening  Never done   DTaP/Tdap/Td (1 - Tdap) Never done   INFLUENZA VACCINE  Never done   PAP-Cervical Cytology Screening  05/25/2022   PAP SMEAR-Modifier  05/25/2022    Patient Care Team: Pcp, No as PCP - General  Review of Systems  {Labs  Heme  Chem  Endocrine  Serology  Results Review (optional):23779}   Objective    LMP 06/19/2022  {Show previous vital signs (optional):23777}  Physical Exam ***  Depression Screen     No data to display         No  results found for any visits on 06/29/22.  Assessment & Plan     ***

## 2023-01-01 ENCOUNTER — Other Ambulatory Visit: Payer: Self-pay

## 2023-01-01 ENCOUNTER — Emergency Department
Admission: EM | Admit: 2023-01-01 | Discharge: 2023-01-01 | Disposition: A | Discharge status: Home / Self Care | Payer: Self-pay | Attending: Emergency Medicine | Admitting: Emergency Medicine

## 2023-01-01 DIAGNOSIS — J039 Acute tonsillitis, unspecified: Secondary | ICD-10-CM | POA: Insufficient documentation

## 2023-01-01 LAB — GROUP A STREP BY PCR: Group A Strep by PCR: NOT DETECTED

## 2023-01-01 NOTE — Discharge Instructions (Addendum)
Please read the attached information about tonsillitis.   You can take 650 mg of Tylenol and 600 mg of ibuprofen every 6 hours as needed for pain.  I have attached information for ear nose and throat follow-up, you can schedule an appointment if you would like to consider having your tonsils removed or if you are not having improvement in your symptoms.

## 2023-01-01 NOTE — ED Provider Notes (Signed)
[facility] Provider Note    Event Date/Time   First MD Initiated Contact with Patient 01/01/23 1614     (approximate)   History   Sore Throat   HPI  Heather Newton is a 22 y.o. female with no PMH who presents for evaluation of sore throat and swollen tonsils since this morning.  Patient states she has had strep throat a few times.  She denies fever, cough and other cold symptoms.  Patient states it is painful and difficult to swallow however denies any respiratory distress.      Physical Exam   Triage Vital Signs: ED Triage Vitals  Encounter Vitals Group     BP 01/01/23 1507 125/67     Systolic BP Percentile --      Diastolic BP Percentile --      Pulse Rate 01/01/23 1507 85     Resp 01/01/23 1507 18     Temp 01/01/23 1507 99 F (37.2 C)     Temp Source 01/01/23 1507 Oral     SpO2 01/01/23 1507 98 %     Weight 01/01/23 1505 242 lb 8.1 oz (110 kg)     Height 01/01/23 1505 5\' 9"  (1.753 m)     Head Circumference --      Peak Flow --      Pain Score 01/01/23 1504 7     Pain Loc --      Pain Education --      Exclude from Growth Chart --     Most recent vital signs: Vitals:   01/01/23 1507  BP: 125/67  Pulse: 85  Resp: 18  Temp: 99 F (37.2 C)  SpO2: 98%    General: Awake, no distress.  CV:  Good peripheral perfusion.  Resp:  Normal effort.  Abd:  No distention.  Other:  Oral mucous membranes moist, tonsillar enlargement however no exudates.  Tender lymphadenopathy on the neck.  Tolerating oral secretions.   ED Results / Procedures / Treatments   Labs (all labs ordered are listed, but only abnormal results are displayed) Labs Reviewed  GROUP A STREP BY PCR     PROCEDURES:  Critical Care performed: No  Procedures   MEDICATIONS ORDERED IN ED: Medications - No data to display   IMPRESSION / MDM / ASSESSMENT AND PLAN / ED COURSE  I reviewed the triage vital signs and the nursing notes.                              23 year old female presents for evaluation of sore throat.  VSS in triage.  NAD on exam.  Differential diagnosis includes, but is not limited to, strep throat, viral pharyngitis, URI.  Patient's presentation is most consistent with acute complicated illness / injury requiring diagnostic workup.  Patient's strep test was negative, however her tonsils are enlarged.  I believe she has a viral tonsillitis.  I advised her on symptomatic treatment using Tylenol and ibuprofen.  We also discussed ENT follow-up for possible tonsil removal given her frequent history of strep throat and tonsillitis.  I advised her to see ENT or return to the ED if she does not have an improvement in her symptoms.  Patient voiced understanding, all questions were answered and she was stable at discharge.      FINAL CLINICAL IMPRESSION(S) / ED DIAGNOSES   Final diagnoses:  Tonsillitis     Rx / DC Orders  ED Discharge Orders     None        Note:  This document was prepared using Dragon voice recognition software and may include unintentional dictation errors.   Cameron Ali, PA-C 01/01/23 1633    Dionne Bucy, MD 01/01/23 1907

## 2023-01-01 NOTE — ED Triage Notes (Signed)
Pt reports sore throat and swollen tonsils since this am. Pt reports has a big hx of strep throat. Denies fevers, cough or other symptoms.

## 2023-01-04 ENCOUNTER — Other Ambulatory Visit: Payer: Self-pay

## 2023-01-04 ENCOUNTER — Emergency Department
Admission: EM | Admit: 2023-01-04 | Discharge: 2023-01-04 | Disposition: A | Discharge status: Home / Self Care | Payer: Self-pay | Attending: Emergency Medicine | Admitting: Emergency Medicine

## 2023-01-04 DIAGNOSIS — J029 Acute pharyngitis, unspecified: Secondary | ICD-10-CM | POA: Insufficient documentation

## 2023-01-04 LAB — GROUP A STREP BY PCR: Group A Strep by PCR: NOT DETECTED

## 2023-01-04 MED ORDER — AMOXICILLIN-POT CLAVULANATE 875-125 MG PO TABS: 1.0000 | ORAL_TABLET | Freq: Two times a day (BID) | ORAL | 0 refills | Status: AC

## 2023-01-04 MED ORDER — AMOXICILLIN-POT CLAVULANATE 875-125 MG PO TABS
1.0000 | ORAL_TABLET | Freq: Once | ORAL | Status: DC
  Filled 2023-01-04: qty 1

## 2023-01-04 NOTE — Discharge Instructions (Addendum)
Take Augmentin twice daily for seven days.

## 2023-01-04 NOTE — ED Triage Notes (Signed)
Patient states sore throat since 01/01/2023, has been seen here for same.

## 2023-01-04 NOTE — ED Provider Notes (Signed)
[facility] Provider Note  Patient Contact: 4:15 PM (approximate)   History   Sore Throat   HPI  Heather Newton is a 22 y.o. female with an unremarkable past medical history, presents to the emergency department with persistent pharyngitis.  Patient was seen and evaluated on 01/01/2023 and was diagnosed with viral pharyngitis but she feels as though her symptoms are worsening.  She has had no associated rhinorrhea, nasal congestion or nonproductive cough.  No vomiting or diarrhea.  No sick contacts in the home with similar symptoms.      Physical Exam   Triage Vital Signs: ED Triage Vitals  Encounter Vitals Group     BP 01/04/23 1608 123/79     Systolic BP Percentile --      Diastolic BP Percentile --      Pulse Rate 01/04/23 1608 92     Resp 01/04/23 1608 20     Temp 01/04/23 1608 98.8 F (37.1 C)     Temp Source 01/04/23 1608 Oral     SpO2 01/04/23 1608 100 %     Weight --      Height 01/04/23 1606 5\' 9"  (1.753 m)     Head Circumference --      Peak Flow --      Pain Score 01/04/23 1606 9     Pain Loc --      Pain Education --      Exclude from Growth Chart --     Most recent vital signs: Vitals:   01/04/23 1608 01/04/23 1717  BP: 123/79 119/76  Pulse: 92 88  Resp: 20 16  Temp: 98.8 F (37.1 C)   SpO2: 100% 100%     General: Alert and in no acute distress. Eyes:  PERRL. EOMI. Head: No acute traumatic findings ENT:      Nose: No congestion/rhinnorhea.      Mouth/Throat: Mucous membranes are moist. Posterior pharynx is erythematous with tonsillar exudate. Uvula is midline.  Neck: No stridor. No cervical spine tenderness to palpation. Hematological/Lymphatic/Immunilogical: Palpable cervical lymphadenopathy.  Cardiovascular:  Good peripheral perfusion Respiratory: Normal respiratory effort without tachypnea or retractions. Lungs CTAB. Good air entry to the bases with no decreased or absent breath sounds. Gastrointestinal: Bowel  sounds 4 quadrants. Soft and nontender to palpation. No guarding or rigidity. No palpable masses. No distention. No CVA tenderness. Musculoskeletal: Full range of motion to all extremities.  Neurologic:  No gross focal neurologic deficits are appreciated.  Skin:   No rash noted Other:   ED Results / Procedures / Treatments   Labs (all labs ordered are listed, but only abnormal results are displayed) Labs Reviewed  GROUP A STREP BY PCR      PROCEDURES:  Critical Care performed: No  Procedures   MEDICATIONS ORDERED IN ED: Medications - No data to display   IMPRESSION / MDM / ASSESSMENT AND PLAN / ED COURSE  I reviewed the triage vital signs and the nursing notes.                              Assessment and plan:  Pharyngitis:   22 year old female presents to the emergency department with persistent pharyngitis since being evaluated on 01/01/2023.  Vital signs were reassuring at triage.  On exam, patient was alert and nontoxic-appearing.  Patient was maintaining her own secretions easily.  Group A strep testing was negative.  Will treat patient empirically with Augmentin.  Return precautions were given to return with new or worsening symptoms.  All patient questions were answered.   FINAL CLINICAL IMPRESSION(S) / ED DIAGNOSES   Final diagnoses:  Pharyngitis, unspecified etiology     Rx / DC Orders   ED Discharge Orders          Ordered    amoxicillin-clavulanate (AUGMENTIN) 875-125 MG tablet  2 times daily        01/04/23 1712             Note:  This document was prepared using Dragon voice recognition software and may include unintentional dictation errors.   Pia Mau [place], Cordelia Poche 01/04/23 2218    Minna Antis, MD 01/05/23 Ernestina [place]

## 2023-01-09 ENCOUNTER — Other Ambulatory Visit: Payer: Self-pay

## 2023-01-09 ENCOUNTER — Emergency Department
Admission: EM | Admit: 2023-01-09 | Discharge: 2023-01-09 | Disposition: A | Discharge status: Home / Self Care | Payer: Self-pay | Attending: Emergency Medicine | Admitting: Emergency Medicine

## 2023-01-09 DIAGNOSIS — S61412A Laceration without foreign body of left hand, initial encounter: Secondary | ICD-10-CM | POA: Insufficient documentation

## 2023-01-09 DIAGNOSIS — W268XXA Contact with other sharp object(s), not elsewhere classified, initial encounter: Secondary | ICD-10-CM | POA: Insufficient documentation

## 2023-01-09 DIAGNOSIS — Y990 Civilian activity done for income or pay: Secondary | ICD-10-CM | POA: Insufficient documentation

## 2023-01-09 NOTE — ED Provider Notes (Signed)
[facility] Provider Note  Patient Contact: 6:59 PM (approximate)   History   Laceration   HPI  Heather Newton is a 22 y.o. female presents to the emergency department with a 1 cm superficial appearing left hand palmar laceration sustained after patient resting her hand against a metal gate at work.  No numbness or tingling in the left hand.  No similar injuries in the past.      Physical Exam   Triage Vital Signs: ED Triage Vitals  Encounter Vitals Group     BP 01/09/23 1813 121/87     Systolic BP Percentile --      Diastolic BP Percentile --      Pulse Rate 01/09/23 1813 97     Resp 01/09/23 1813 18     Temp 01/09/23 1813 98.6 F (37 C)     Temp src --      SpO2 01/09/23 1813 97 %     Weight 01/09/23 1813 242 lb 8.1 oz (110 kg)     Height 01/09/23 1813 5\' 9"  (1.753 m)     Head Circumference --      Peak Flow --      Pain Score 01/09/23 1812 5     Pain Loc --      Pain Education --      Exclude from Growth Chart --     Most recent vital signs: Vitals:   01/09/23 1813  BP: 121/87  Pulse: 97  Resp: 18  Temp: 98.6 F (37 C)  SpO2: 97%     General: Alert and in no acute distress. Eyes:  PERRL. EOMI. Head: No acute traumatic findings ENT:      Nose: No congestion/rhinnorhea.      Mouth/Throat: Mucous membranes are moist.  Neck: No stridor. No cervical spine tenderness to palpation. Cardiovascular:  Good peripheral perfusion Respiratory: Normal respiratory effort without tachypnea or retractions. Lungs CTAB. Good air entry to the bases with no decreased or absent breath sounds. Gastrointestinal: Bowel sounds 4 quadrants. Soft and nontender to palpation. No guarding or rigidity. No palpable masses. No distention. No CVA tenderness. Musculoskeletal: Full range of motion to all extremities.  Neurologic:  No gross focal neurologic deficits are appreciated.  Skin: Patient has a 1 cm left-sided palmar hand laceration.    ED Results  / Procedures / Treatments   Labs (all labs ordered are listed, but only abnormal results are displayed) Labs Reviewed - No data to display      PROCEDURES:  Critical Care performed: No  ..Laceration Repair  Date/Time: 01/09/2023 7:00 PM  Performed by: Orvil Feil, PA-C Authorized by: Orvil Feil, PA-C   Consent:    Consent obtained:  Verbal   Risks discussed:  Infection and pain Universal protocol:    Procedure explained and questions answered to patient or proxy's satisfaction: yes     Patient identity confirmed:  Verbally with patient Anesthesia:    Anesthesia method:  None Laceration details:    Location:  Hand   Hand location:  L palm   Length (cm):  1   Depth (mm):  5 Pre-procedure details:    Preparation:  Patient was prepped and draped in usual sterile fashion Exploration:    Limited defect created (wound extended): no   Treatment:    Area cleansed with:  Povidone-iodine   Amount of cleaning:  Standard   Irrigation solution:  Sterile saline   Visualized foreign bodies/material removed: no   Skin  repair:    Repair method:  Tissue adhesive Approximation:    Approximation:  Close    MEDICATIONS ORDERED IN ED: Medications - No data to display   IMPRESSION / MDM / ASSESSMENT AND PLAN / ED COURSE  I reviewed the triage vital signs and the nursing notes.                             Assessment and plan Hand laceration 22 year old female presents to the emergency department with a superficial appearing left hand laceration that was repaired in the emergency department without complication.  Patient education regarding wound care was given.  Tylenol and ibuprofen alternating were recommended for discomfort.  All patient questions were answered.  FINAL CLINICAL IMPRESSION(S) / ED DIAGNOSES   Final diagnoses:  Laceration of left hand without foreign body, initial encounter     Rx / DC Orders   ED Discharge Orders     None        Note:   This document was prepared using Dragon voice recognition software and may include unintentional dictation errors.   Pia Mau [place], Cordelia Poche 01/09/23 1906    Jene Every, MD 01/09/23 [phone]

## 2023-01-09 NOTE — ED Triage Notes (Signed)
Pt to ED for laceration to left palm, cut it on gate at work.  Works at [employer], states it is not going to be workers comp. Bleeding controlled

## 2023-03-15 ENCOUNTER — Ambulatory Visit: Payer: Self-pay | Admitting: Nurse Practitioner

## 2023-03-17 ENCOUNTER — Other Ambulatory Visit: Payer: Self-pay

## 2023-03-17 ENCOUNTER — Emergency Department
Admission: EM | Admit: 2023-03-17 | Discharge: 2023-03-17 | Disposition: A | Discharge status: Home / Self Care | Payer: Self-pay | Attending: Emergency Medicine | Admitting: Emergency Medicine

## 2023-03-17 DIAGNOSIS — J020 Streptococcal pharyngitis: Secondary | ICD-10-CM | POA: Insufficient documentation

## 2023-03-17 LAB — GROUP A STREP BY PCR: Group A Strep by PCR: NOT DETECTED

## 2023-03-17 MED ORDER — AMOXICILLIN 500 MG PO CAPS
500.0000 mg | ORAL_CAPSULE | Freq: Once | ORAL | Status: AC
  Administered 2023-03-17: 500 mg via ORAL
  Filled 2023-03-17: qty 1

## 2023-03-17 MED ORDER — DEXAMETHASONE SODIUM PHOSPHATE 10 MG/ML IJ SOLN
10.0000 mg | Freq: Once | INTRAMUSCULAR | Status: AC
  Administered 2023-03-17: 10 mg via INTRAMUSCULAR
  Filled 2023-03-17: qty 1

## 2023-03-17 MED ORDER — AMOXICILLIN 500 MG PO TABS: 500.0000 mg | ORAL_TABLET | Freq: Two times a day (BID) | ORAL | 0 refills | Status: AC

## 2023-03-17 NOTE — ED Triage Notes (Signed)
Pt to ed from home via POV for a sore throat. Pt states : "I come in here all the time for tonsillitis". Pt is caox4, in no acute distress and ambulatory in triage.

## 2023-03-17 NOTE — ED Notes (Signed)
Patient verbalizes understanding of discharge instructions. Opportunity for questioning and answers were provided. Armband removed by staff, pt discharged from ED. Ambulated out to lobby with friend

## 2023-03-17 NOTE — Discharge Instructions (Addendum)
You can take 650 mg of Tylenol and 600 mg of ibuprofen every 6 hours as needed for pain.  Make sure you throw away your toothbrush 2-3 days before finishing your antibiotics.  [facility] [address] [place], [place] [address] [phone]  Office Hours Monday: Closed Tuesday: 9:00am - 4:00pm Wednesday: 9:00am - 4:00pm Thursday: 9:00am - 8:00pm Friday: Closed

## 2023-03-17 NOTE — ED Provider Notes (Signed)
[facility] Provider Note    Event Date/Time   First MD Initiated Contact with Patient 03/17/23 2008     (approximate)   History   Sore Throat (X 5 days)   HPI  Heather Newton is a 22 y.o. female presents for evaluation of sore throat x 5 days.  Patient has had tonsillitis several times, she was last treated for this in the ED on 7/17.  Patient has not been able to see an ENT to discuss tonsillectomy as she does not have insurance.     Physical Exam   Triage Vital Signs: ED Triage Vitals  Encounter Vitals Group     BP 03/17/23 1957 135/84     Systolic BP Percentile --      Diastolic BP Percentile --      Pulse Rate 03/17/23 1957 96     Resp 03/17/23 1957 20     Temp 03/17/23 1957 99.4 F (37.4 C)     Temp src --      SpO2 03/17/23 1957 100 %     Weight 03/17/23 1957 255 lb (115.7 kg)     Height 03/17/23 1957 5\' 9"  (1.753 m)     Head Circumference --      Peak Flow --      Pain Score 03/17/23 1957 8     Pain Loc --      Pain Education --      Exclude from Growth Chart --     Most recent vital signs: Vitals:   03/17/23 1957  BP: 135/84  Pulse: 96  Resp: 20  Temp: 99.4 F (37.4 C)  SpO2: 100%    General: Awake, no distress.  CV:  Good peripheral perfusion.  RRR. Resp:  Normal effort.  CTAB. Abd:  No distention.  Other:  Oral mucous membranes are moist, pharynx is erythematous, tonsils are enlarged with white exudates, uvula is midline.   ED Results / Procedures / Treatments   Labs (all labs ordered are listed, but only abnormal results are displayed) Labs Reviewed  GROUP A STREP BY PCR    PROCEDURES:  Critical Care performed: No  Procedures   MEDICATIONS ORDERED IN ED: Medications  dexamethasone (DECADRON) injection 10 mg (has no administration in time range)  amoxicillin (AMOXIL) capsule 500 mg (has no administration in time range)     IMPRESSION / MDM / ASSESSMENT AND PLAN / ED COURSE  I reviewed the triage  vital signs and the nursing notes.                             22 year old female presents for evaluation of sore throat.  Vital signs stable in triage patient NAD on exam.  Differential diagnosis includes, but is not limited to, strep pharyngitis, viral pharyngitis, tonsillitis, retropharyngeal abscess, peritonsillar abscess.  Patient's presentation is most consistent with acute, uncomplicated illness.  Patient strep test was negative, however given findings on exam I am going to treat her anyway.  Patient was given her first dose of amoxicillin while in the ED today.  She also reports a significant amount of pain but did not want pain medication.  She was given a shot of dexamethasone to help with the inflammation.  I advised her on over-the-counter pain management using Tylenol and ibuprofen.  I [facility] and tried to pursue [facility]illectomy through them.  Patient voiced understanding, questions were answered  and she is stable at discharge.    FINAL CLINICAL IMPRESSION(S) / ED DIAGNOSES   Final diagnoses:  Strep pharyngitis     Rx / DC Orders   ED Discharge Orders          Ordered    amoxicillin (AMOXIL) 500 MG tablet  2 times daily        03/17/23 2101             Note:  This document was prepared using Dragon voice recognition software and may include unintentional dictation errors.   Cameron Ali, PA-C 03/17/23 2107    Dionne Bucy, MD 03/18/23 [phone]

## 2023-05-29 ENCOUNTER — Other Ambulatory Visit: Payer: Self-pay

## 2023-05-29 ENCOUNTER — Emergency Department
Admission: EM | Admit: 2023-05-29 | Discharge: 2023-05-29 | Disposition: A | Discharge status: Home / Self Care | Payer: Self-pay | Attending: Emergency Medicine | Admitting: Emergency Medicine

## 2023-05-29 ENCOUNTER — Encounter: Payer: Self-pay | Admitting: Emergency Medicine

## 2023-05-29 DIAGNOSIS — Z202 Contact with and (suspected) exposure to infections with a predominantly sexual mode of transmission: Secondary | ICD-10-CM

## 2023-05-29 DIAGNOSIS — Z113 Encounter for screening for infections with a predominantly sexual mode of transmission: Secondary | ICD-10-CM | POA: Insufficient documentation

## 2023-05-29 LAB — WET PREP, GENITAL
Clue Cells Wet Prep HPF POC: NONE SEEN
Sperm: NONE SEEN
Trich, Wet Prep: NONE SEEN
WBC, Wet Prep HPF POC: <10 (ref <10)
Yeast Wet Prep HPF POC: NONE SEEN

## 2023-05-29 LAB — URINALYSIS, ROUTINE W REFLEX MICROSCOPIC
Bilirubin Urine: NEGATIVE
Glucose, UA: NEGATIVE mg/dL
Hgb urine dipstick: NEGATIVE
Ketones, ur: NEGATIVE mg/dL
Nitrite: NEGATIVE
Protein, ur: NEGATIVE mg/dL
Specific Gravity, Urine: 1.024 (ref 1.005–1.030)
pH: 6 (ref 5.0–8.0)

## 2023-05-29 LAB — CHLAMYDIA/NGC RT PCR (ARMC ONLY)
Chlamydia Tr: NOT DETECTED
N gonorrhoeae: NOT DETECTED

## 2023-05-29 NOTE — Discharge Instructions (Signed)
You were evaluated in the ED for STD screening.  Your chlamydia gonorrhea is pending at this time if results are positive you will receive a call in the appropriate antibiotics to your pharmacy.   A list of primary care providers have been provided for you for follow-up as needed including the health department to provide sex education and STD/STI prevention.  Please go to the following website to schedule new (and existing) patient appointments:   [email or web address]   The following is a list of primary care offices in the area who are accepting new patients at this time.  Please reach out to one of them directly and let them know you would like to schedule an appointment to follow up on an Emergency Department visit, and/or to establish a new primary care provider (PCP).  There are likely other primary care clinics in the are who are accepting new patients, but this is an excellent place to start:  [facility] Lead physician: Dr Shirlee Latch [address] [place], [place] [address] [phone]  [facility] Lead Physician: Dr Alba Cory [address], [place], [place] [address] [phone]  [facility]  Lead Physician: Dr Olevia Perches [address] [place], [place] [address] [phone]  [facility] Lead Physician: Dr Sofie Hartigan [address] [place], [place] [address] [phone]  [facility] Lead Physician: Dr Bari Edward [address] [place], [place], [place] [address] [phone]

## 2023-05-29 NOTE — ED Provider Triage Note (Signed)
Emergency Medicine Provider Triage Evaluation Note  Heather Newton , a 22 y.o. female  was evaluated in triage.  Pt complains of STD screening.  Patient is asymptomatic.  No histories of STD  Review of Systems  Positive: Unprotected sexual intercourse. Negative: Vaginal discharge, vaginal bleeding, dysuria, vaginal itching, vaginal odor  Physical Exam  BP (!) 147/72   Pulse 78   Temp 98.8 F (37.1 C) (Oral)   Resp 18   Ht 5\' 9"  (1.753 m)   Wt 122 kg   SpO2 100%   BMI 39.72 kg/m  Gen:   Awake, no distress   Resp:  Normal effort  MSK:   Moves extremities without difficulty  Other:    Medical Decision Making  Medically screening exam initiated at 6:04 PM.  Appropriate orders placed.  Heather Newton was informed that the remainder of the evaluation will be completed by another provider, this initial triage assessment does not replace that evaluation, and the importance of remaining in the ED until their evaluation is complete.     Romeo Apple, Bartolo Montanye A, PA-C 05/29/23 1808

## 2023-05-29 NOTE — ED Triage Notes (Signed)
Patient to ED via POV fro STD testing. States her positive has tested positive to unknown STD. Patient denies any symptoms at this time.

## 2023-05-29 NOTE — ED Provider Notes (Signed)
[facility] Emergency Department Provider Note     Event Date/Time   First MD Initiated Contact with Patient 05/29/23 1936     (approximate)   History   Exposure to STD   HPI  Heather Newton is a 22 y.o. female presents to the ED for STD screening.  Patient reports she is asymptomatic but her partner was tested for an unknown STD and she would like evaluation.  Patient denies vaginal itching, vaginal bleeding, vaginal odor and discharge.  No fevers.     Physical Exam   Triage Vital Signs: ED Triage Vitals [05/29/23 1802]  Encounter Vitals Group     BP (!) 147/72     Systolic BP Percentile      Diastolic BP Percentile      Pulse Rate 78     Resp 18     Temp 98.8 F (37.1 C)     Temp Source Oral     SpO2 100 %     Weight 269 lb (122 kg)     Height 5\' 9"  (1.753 m)     Head Circumference      Peak Flow      Pain Score 0     Pain Loc      Pain Education      Exclude from Growth Chart     Most recent vital signs: Vitals:   05/29/23 1802  BP: (!) 147/72  Pulse: 78  Resp: 18  Temp: 98.8 F (37.1 C)  SpO2: 100%    General Awake, no distress.  HEENT NCAT. PERRL. EOMI. No rhinorrhea. Mucous membranes are moist.  CV:  Good peripheral perfusion.  RESP:  Normal effort.  ABD:  No distention.  Other:  GU - deferred by patient   ED Results / Procedures / Treatments   Labs (all labs ordered are listed, but only abnormal results are displayed) Labs Reviewed  URINALYSIS, ROUTINE W REFLEX MICROSCOPIC - Abnormal; Notable for the following components:      Result Value   Color, Urine YELLOW (*)    APPearance CLOUDY (*)    Leukocytes,Ua TRACE (*)    Bacteria, UA RARE (*)    All other components within normal limits  WET PREP, GENITAL  CHLAMYDIA/NGC RT PCR ([facility] ONLY)              No results found.  PROCEDURES:  Critical Care performed: No  Procedures  MEDICATIONS ORDERED IN ED: Medications - No data to display   IMPRESSION  / MDM / ASSESSMENT AND PLAN / ED COURSE  I reviewed the triage vital signs and the nursing notes.                               22 y.o. female presents to the emergency department for evaluation and treatment of STD screening. See HPI for further details.   Differential diagnosis includes, but is not limited to chlamydia, gonorrhea, STD screening  Patient's presentation is most consistent with acute complicated illness / injury requiring diagnostic workup.  Patient is alert and oriented.  She is hemodynamically stable.  He is asymptomatic but would like STD screening.  She does endorse unprotected sex.  Education was provided for her.  Chlamydia and gonorrhea still pending.  Patient will be discharged.  If tests are positive will receive a call and appropriate antibiotics.  She is in stable condition for discharge home.   FINAL CLINICAL  IMPRESSION(S) / ED DIAGNOSES   Final diagnoses:  Possible exposure to STD    Rx / DC Orders   ED Discharge Orders     None      Note:  This document was prepared using Dragon voice recognition software and may include unintentional dictation errors.    Romeo Apple, Deniz Eskridge A, PA-C 05/29/23 2043    Janith Lima, MD 05/29/23 2121

## 2023-06-05 ENCOUNTER — Emergency Department
Admission: EM | Admit: 2023-06-05 | Discharge: 2023-06-05 | Disposition: A | Discharge status: Home / Self Care | Payer: Self-pay | Attending: Emergency Medicine | Admitting: Emergency Medicine

## 2023-06-05 ENCOUNTER — Other Ambulatory Visit: Payer: Self-pay

## 2023-06-05 ENCOUNTER — Encounter: Payer: Self-pay | Admitting: Emergency Medicine

## 2023-06-05 ENCOUNTER — Emergency Department: Payer: Self-pay

## 2023-06-05 ENCOUNTER — Ambulatory Visit: Payer: Self-pay

## 2023-06-05 DIAGNOSIS — J069 Acute upper respiratory infection, unspecified: Secondary | ICD-10-CM | POA: Insufficient documentation

## 2023-06-05 DIAGNOSIS — Z1152 Encounter for screening for COVID-19: Secondary | ICD-10-CM | POA: Insufficient documentation

## 2023-06-05 LAB — RESP PANEL BY RT-PCR (RSV, FLU A&B, COVID)  RVPGX2
Influenza A by PCR: NEGATIVE
Influenza B by PCR: NEGATIVE
Resp Syncytial Virus by PCR: NEGATIVE
SARS Coronavirus 2 by RT PCR: NEGATIVE

## 2023-06-05 LAB — GROUP A STREP BY PCR: Group A Strep by PCR: NOT DETECTED

## 2023-06-05 MED ORDER — ONDANSETRON 4 MG PO TBDP: 4.0000 mg | ORAL_TABLET | Freq: Four times a day (QID) | ORAL | 0 refills | Status: DC | PRN

## 2023-06-05 MED ORDER — BENZONATATE 100 MG PO CAPS: 100.0000 mg | ORAL_CAPSULE | Freq: Four times a day (QID) | ORAL | 0 refills | Status: DC | PRN

## 2023-06-05 NOTE — ED Provider Notes (Signed)
[facility] Provider Note    Event Date/Time   First MD Initiated Contact with Patient 06/05/23 1048     (approximate)   History   Cough   HPI  Heather Newton is a 22 y.o. female ports no major past medical history  For little over a week now has been experiencing cough nasal congestion.  Last night she felt a little short of breath.  No vomiting, some slight nausea.  She is continue to have a relatively dry cough runny nose  No abdominal pain.   She is experienced some chills fevers and bodyaches along with this.       Physical Exam   Triage Vital Signs: ED Triage Vitals  Encounter Vitals Group     BP 06/05/23 0926 (!) 144/80     Systolic BP Percentile --      Diastolic BP Percentile --      Pulse Rate 06/05/23 0926 94     Resp 06/05/23 0926 18     Temp 06/05/23 0926 98.3 F (36.8 C)     Temp Source 06/05/23 0926 Oral     SpO2 06/05/23 0926 100 %     Weight 06/05/23 0927 269 lb (122 kg)     Height 06/05/23 0927 5\' 9"  (1.753 m)     Head Circumference --      Peak Flow --      Pain Score 06/05/23 0926 0     Pain Loc --      Pain Education --      Exclude from Growth Chart --     Most recent vital signs: Vitals:   06/05/23 0926  BP: (!) 144/80  Pulse: 94  Resp: 18  Temp: 98.3 F (36.8 C)  SpO2: 100%     General: Awake, no distress.  Very pleasant Moist mucous membranes.  Tonsillar hypertrophy but no exudates.  Patient reports about 12 years of known enlarged tonsils CV:  Good peripheral perfusion.  Normal tones and rate Resp:  Normal effort.  Clear lungs bilaterally Abd:  No distention.  Soft nontender nondistended Other:  Awake alert well-appearing very pleasant in no distress.   ED Results / Procedures / Treatments   Labs (all labs ordered are listed, but only abnormal results are displayed) Labs Reviewed  RESP PANEL BY RT-PCR (RSV, FLU A&B, COVID)  [id number]  GROUP A STREP BY PCR     EKG     RADIOLOGY  Due  to downtime/server issue I am unable to review the patient's chest x-ray myself.  Radiology called report to our PA Lauren, and advised "normal chest x-ray" is the report    PROCEDURES:  Critical Care performed: No  Procedures   MEDICATIONS ORDERED IN ED: Medications - No data to display   IMPRESSION / MDM / ASSESSMENT AND PLAN / ED COURSE  I reviewed the triage vital signs and the nursing notes.                              Differential diagnosis includes, but is not limited to, viral illness, URI, less likely pneumonia especially given report of normal chest x-ray, reassuring clinical examination, pharyngitis, etc.  She is awake alert nontoxic well-appearing.  No acute cardiac symptoms.  Normal work of breathing normal oxygen saturation no chest pain.  No notable risk factors for thromboembolism and her symptoms seem most consistent with likely self-limited upper respiratory illness  Patient's presentation  is most consistent with acute complicated illness / injury requiring diagnostic workup.  Labs interpreted as negative COVID and influenza testing.  Negative strep test.  No present indication noted for antivirals.  Will treat with as needed Zofran and Tessalon for cough.  Discussed with the patient is understanding agreeable with plan for these medications  Return precautions and treatment recommendations and follow-up discussed with the patient who is agreeable with the plan.       FINAL CLINICAL IMPRESSION(S) / ED DIAGNOSES   Final diagnoses:  Viral URI with cough     Rx / DC Orders   ED Discharge Orders     None        Note:  This document was prepared using Dragon voice recognition software and may include unintentional dictation errors.   Sharyn Creamer, MD 06/05/23 1110

## 2023-06-05 NOTE — ED Triage Notes (Signed)
Patient to ED via POV for cold like symptoms- headache, cough, congestion. Ongoing x1 week. Body aches all over.

## 2023-06-05 NOTE — ED Provider Triage Note (Addendum)
Emergency Medicine Provider Triage Evaluation Note  Heather Newton , a 22 y.o. female  was evaluated in triage.  Pt complains of cough, headache and congestion for one week.  Review of Systems  Positive: Cough, congestion, headache,fever Negative: chills  Physical Exam  There were no vitals taken for this visit. Gen:   Awake, no distress   Resp:  Normal effort  MSK:   Moves extremities without difficulty  Other:    Medical Decision Making  Medically screening exam initiated at 9:26 AM.  Appropriate orders placed.  Heather Newton was informed that the remainder of the evaluation will be completed by another provider, this initial triage assessment does not replace that evaluation, and the importance of remaining in the ED until their evaluation is complete.  Chest xray is negative.   Heather Ali, PA-C 06/05/23 4098    Heather Ali, PA-C 06/05/23 1043

## 2023-08-03 ENCOUNTER — Ambulatory Visit: Payer: Self-pay

## 2023-08-17 ENCOUNTER — Ambulatory Visit: Payer: Self-pay

## 2023-10-12 ENCOUNTER — Encounter: Payer: Self-pay | Admitting: Emergency Medicine

## 2023-10-12 ENCOUNTER — Emergency Department
Admission: EM | Admit: 2023-10-12 | Discharge: 2023-10-12 | Disposition: A | Discharge status: Home / Self Care | Payer: Self-pay | Attending: Emergency Medicine | Admitting: Emergency Medicine

## 2023-10-12 ENCOUNTER — Other Ambulatory Visit: Payer: Self-pay

## 2023-10-12 DIAGNOSIS — J020 Streptococcal pharyngitis: Secondary | ICD-10-CM | POA: Insufficient documentation

## 2023-10-12 LAB — GROUP A STREP BY PCR: Group A Strep by PCR: DETECTED — AB

## 2023-10-12 MED ORDER — AMOXICILLIN 875 MG PO TABS: 875.0000 mg | ORAL_TABLET | Freq: Two times a day (BID) | ORAL | 0 refills | Status: DC

## 2023-10-12 NOTE — ED Notes (Signed)
Pt d/c home per EDP order. Discharge summary reviewed, pt verbalizes understanding. NAD.

## 2023-10-12 NOTE — ED Triage Notes (Signed)
 Pt via POV from home. Pt c/o sore throat and painful swallowing. Pt has a hx of recurrent strep and tonsillitis. Denies fever. Denies cough. Pt is A&OX4 and NAD, ambulatory to triage.

## 2023-10-12 NOTE — ED Provider Notes (Signed)
 [facility] Provider Note    Event Date/Time   First MD Initiated Contact with Patient 10/12/23 1325     (approximate)   History   Sore Throat   HPI  Heather Newton is a 23 y.o. female   presents to the ED with complaint of sore throat for the last 3 days.  Patient denies any fever.  She does report that she has recurrent strep and tonsillitis.  She continues to eat and drink as normal.      Physical Exam   Triage Vital Signs: ED Triage Vitals  Encounter Vitals Group     BP 10/12/23 1237 123/81     Systolic BP Percentile --      Diastolic BP Percentile --      Pulse Rate 10/12/23 1237 (!) 112     Resp 10/12/23 1237 18     Temp 10/12/23 1237 98.6 F (37 C)     Temp Source 10/12/23 1237 Oral     SpO2 10/12/23 1237 100 %     Weight 10/12/23 1235 250 lb (113.4 kg)     Height 10/12/23 1235 5\' 9"  (1.753 m)     Head Circumference --      Peak Flow --      Pain Score 10/12/23 1235 8     Pain Loc --      Pain Education --      Exclude from Growth Chart --     Most recent vital signs: Vitals:   10/12/23 1237  BP: 123/81  Pulse: (!) 112  Resp: 18  Temp: 98.6 F (37 C)  SpO2: 100%     General: Awake, no distress.  CV:  Good peripheral perfusion.  Resp:  Normal effort.  Abd:  No distention.  Other:  Posterior pharynx erythematous without exudate.  Uvula is midline.  Neck supple with minimal cervical lymphadenopathy.  Oral mucosa is moist.  Speech is normal.   ED Results / Procedures / Treatments   Labs (all labs ordered are listed, but only abnormal results are displayed) Labs Reviewed  GROUP A STREP BY PCR - Abnormal; Notable for the following components:      Result Value   Group A Strep by PCR DETECTED (*)    All other components within normal limits      PROCEDURES:  Critical Care performed:   Procedures   MEDICATIONS ORDERED IN ED: Medications - No data to display   IMPRESSION / MDM / ASSESSMENT AND PLAN / ED  COURSE  I reviewed the triage vital signs and the nursing notes.   Differential diagnosis includes, but is not limited to, tonsillitis, strep pharyngitis, viral pharyngitis, seasonal allergies, mono.  Presents to the ED with complaint of sore throat for the last 3 days.  Patient was made aware that her strep test was positive and that she is contagious for 24 hours while being on the antibiotic.  She is encouraged to take Tylenol and ibuprofen as needed for throat pain and make sure she is drinking plenty of fluids.  She is to follow-up with her PCP or Dr. Silvestre Drum if any continued problems.  If any worsening of her throat symptoms she is return to the emergency department over the weekend.      Patient's presentation is most consistent with acute complicated illness / injury requiring diagnostic workup.  FINAL CLINICAL IMPRESSION(S) / ED DIAGNOSES   Final diagnoses:  Strep pharyngitis     Rx / DC Orders  ED Discharge Orders          Ordered    amoxicillin  (AMOXIL ) 875 MG tablet  2 times daily        10/12/23 1333             Note:  This document was prepared using Dragon voice recognition software and may include unintentional dictation errors.   Stafford Eagles, PA-C 10/12/23 1337    Twilla Galea, MD 10/12/23 [phone]

## 2023-10-12 NOTE — Discharge Instructions (Addendum)
 Follow-up with your primary care provider or Dr. Silvestre Drum who is on-call for [facility] if any continued problems or worsening return to the emergency department.  A prescription for amoxicillin  875 twice daily for 10 days was sent to the pharmacy.  If these tablets are too large to swallow break them in two and taking half at a time.  You are considered contagious until after you have been on antibiotics for 24 hours.  Also the toothbrush that you currently have should be thrown away after 2 to 3 days of being on the antibiotic a new 1 should be used.

## 2023-10-13 ENCOUNTER — Ambulatory Visit: Payer: Self-pay

## 2023-10-23 ENCOUNTER — Encounter: Payer: Self-pay | Admitting: Emergency Medicine

## 2023-10-23 ENCOUNTER — Emergency Department
Admission: EM | Admit: 2023-10-23 | Discharge: 2023-10-23 | Disposition: A | Discharge status: Home / Self Care | Payer: Self-pay | Attending: Emergency Medicine | Admitting: Emergency Medicine

## 2023-10-23 ENCOUNTER — Other Ambulatory Visit: Payer: Self-pay

## 2023-10-23 ENCOUNTER — Emergency Department: Payer: Self-pay

## 2023-10-23 DIAGNOSIS — T63461A Toxic effect of venom of wasps, accidental (unintentional), initial encounter: Secondary | ICD-10-CM | POA: Insufficient documentation

## 2023-10-23 DIAGNOSIS — T7840XA Allergy, unspecified, initial encounter: Secondary | ICD-10-CM

## 2023-10-23 DIAGNOSIS — S40862A Insect bite (nonvenomous) of left upper arm, initial encounter: Secondary | ICD-10-CM

## 2023-10-23 LAB — TROPONIN I (HIGH SENSITIVITY)
Troponin I (High Sensitivity): <2 ng/L (ref <18)
Troponin I (High Sensitivity): <2 ng/L (ref <18)

## 2023-10-23 MED ORDER — DIPHENHYDRAMINE HCL 25 MG PO CAPS
25.0000 mg | ORAL_CAPSULE | Freq: Once | ORAL | Status: AC
  Administered 2023-10-23: 25 mg via ORAL
  Filled 2023-10-23: qty 1

## 2023-10-23 MED ORDER — CETIRIZINE HCL 5 MG/5ML PO SOLN
5.0000 mg | Freq: Once | ORAL | Status: AC
  Administered 2023-10-23: 5 mg via ORAL
  Filled 2023-10-23 (×2): qty 5

## 2023-10-23 MED ORDER — CETIRIZINE HCL 10 MG PO TABS: 10.0000 mg | ORAL_TABLET | Freq: Every day | ORAL | 0 refills | Status: DC

## 2023-10-23 MED ORDER — IBUPROFEN 800 MG PO TABS
800.0000 mg | ORAL_TABLET | Freq: Once | ORAL | Status: AC
  Administered 2023-10-23: 800 mg via ORAL
  Filled 2023-10-23: qty 1

## 2023-10-23 MED ORDER — DEXAMETHASONE SODIUM PHOSPHATE 10 MG/ML IJ SOLN
10.0000 mg | Freq: Once | INTRAMUSCULAR | Status: AC
  Administered 2023-10-23: 10 mg via INTRAVENOUS
  Filled 2023-10-23: qty 1

## 2023-10-23 MED ORDER — PREDNISONE 50 MG PO TABS: ORAL_TABLET | ORAL | 0 refills | Status: DC

## 2023-10-23 NOTE — ED Notes (Signed)
 See triage note  Presents with wasp sting  Noticed sting to left upper arm  No resp issues

## 2023-10-23 NOTE — Discharge Instructions (Signed)
 You have been diagnosed with allergic reaction to insect bite.  You can take Zyrtec 1 tablet by mouth daily.  Continue prednisone 1 tablet with  breakfast.  Please come back to ED if you have new symptoms or symptoms worsen.

## 2023-10-23 NOTE — ED Triage Notes (Signed)
 Wasp sting to left upper arm  AAOx3.  Skin warm and dry. NAD

## 2023-10-23 NOTE — ED Provider Notes (Signed)
 [facility] Provider Note    Event Date/Time   First MD Initiated Contact with Patient 10/23/23 1546     (approximate)   History   Insect Bite    HPI  Heather Newton is a 23 y.o. female    with a past medical history of pharyngitis, tonsillitis, who presents to the ED complaining of wasp bite this morning and 11 AM. According to the patient, she was sleeping on her bedroom and woke up with it was on her left arm.  The insect bite her twice on her left arm.  Left arm was red, warm and tender.  patient states that she was having central chest pain, described as someone  was sitting on her chest.  States chest pain was intermittent, x 3.  Patient denies previous history of chest pain, patient denies wheezing, difficulty breathing, lip edema, abdominal cramping, vomit.  Patient did not take any medications.      Physical Exam   Triage Vital Signs: ED Triage Vitals  Encounter Vitals Group     BP 10/23/23 1321 (!) 132/110     Systolic BP Percentile --      Diastolic BP Percentile --      Pulse Rate 10/23/23 1321 98     Resp 10/23/23 1321 16     Temp 10/23/23 1321 98.6 F (37 C)     Temp Source 10/23/23 1321 Oral     SpO2 10/23/23 1321 100 %     Weight 10/23/23 1320 250 lb (113.4 kg)     Height 10/23/23 1546 5\' 9"  (1.753 m)     Head Circumference --      Peak Flow --      Pain Score 10/23/23 1320 3     Pain Loc --      Pain Education --      Exclude from Growth Chart --     Most recent vital signs: Vitals:   10/23/23 1321 10/23/23 1553  BP: (!) 132/110 (!) 128/90  Pulse: 98 88  Resp: 16 16  Temp: 98.6 F (37 C)   SpO2: 100% 100%     Constitutional: Alert, NAD. Able to speak in complete sentences without cough or dyspnea.  Patient is not hypotensive Eyes: Conjunctivae are normal.  Head: Atraumatic. Nose: No congestion/rhinnorhea. Mouth/Throat: Mucous membranes are moist.  No lip edema, no stridor Neck: Painless ROM. Supple. No JVD, nodes,  thyromegaly  Cardiovascular:   Good peripheral circulation.RRR no murmurs, gallops, rubs  Respiratory: Normal respiratory effort.  No retractions. Clear to auscultation bilaterally without wheezing or crackles  Gastrointestinal: Soft and nontender.  Musculoskeletal:  no deformity Left arm: Presence of circular lesion about 16 cm, erythematosus, warm, tender to palpation, with limited borders.  And another circular lesion about 8 cm, erythematosus, warm and tender to palpation.  Both in the anterior side of the arm.  Full ROM, pulses positive, sensation intact, strength 5/5 Neurologic:  MAE spontaneously. No gross focal neurologic deficits are appreciated.  Skin:  Skin is warm, dry and intact. No rash noted. Psychiatric: Mood and affect are normal. Speech and behavior are normal.    ED Results / Procedures / Treatments   Labs (all labs ordered are listed, but only abnormal results are displayed) Labs Reviewed  POC URINE PREG, ED  TROPONIN I (HIGH SENSITIVITY)  TROPONIN I (HIGH SENSITIVITY)     EKG See physician read    RADIOLOGY I independently reviewed and interpreted imaging and agree with radiologists findings.  PROCEDURES:  Critical Care performed:   Procedures   MEDICATIONS ORDERED IN ED: Medications  dexamethasone  (DECADRON ) injection 10 mg (10 mg Intravenous Given 10/23/23 1651)  diphenhydrAMINE (BENADRYL) capsule 25 mg (25 mg Oral Given 10/23/23 1651)  cetirizine HCl (Zyrtec) 5 MG/5ML solution 5 mg (5 mg Oral Given 10/23/23 1752)  ibuprofen (ADVIL) tablet 800 mg (800 mg Oral Given 10/23/23 1752)   Clinical Course as of 10/23/23 1901  Mon Oct 23, 2023  1713 Reassessed the patient, patient  states feeling better, pain continues, patient denies chest pain.  Patient does not want to take Benadryl because she needs to drive.  I will order cetirizine and ibuprofen, waiting for report of chest x-ray and troponin.  I applied cold pack [AE]  1746 DG Chest 2 View No active  cardiopulmonary disease [AE]  1804 Troponin I (High Sensitivity) Normal limits [AE]  1851 Reassessed the patient, patient states feeling better, without pain.  Physical exam erythema in the second lesion is mild, first lesion continue with erythema but is not tender.  Waiting for second troponin to discharge patient with cetirizine, ibuprofen, prednisone [AE]    Clinical Course User Index [AE] Awilda Lennox, PA-C    IMPRESSION / MDM / ASSESSMENT AND PLAN / ED COURSE  I reviewed the triage vital signs and the nursing notes.  Differential diagnosis includes, but is not limited to, chest pain, insect bite, allergic reaction to insect bite, cellulitis, anaphylactic reaction  Patient's presentation is most consistent with acute complicated illness / injury requiring diagnostic workup.   Patient's diagnosis is consistent with allergic reaction to insect bite. I independently reviewed and interpreted imaging and agree with radiologists findings. Labs are  reassuring ruling out MI with troponin I within normal limits. I did review the patient's allergies and medications.during admission patient received cetirizine, ibuprofen, dexamethasone , applied  cold pack.  The patient is in stable and satisfactory condition for discharge home  Patient will be discharged home with prescriptions for cetirizine, prednisone, ibuprofen. Patient is to follow up with PCP as needed or otherwise directed. Patient is given ED precautions to return to the ED for any worsening or new symptoms. Discussed plan of care with patient, answered all of patient's questions, Patient agreeable to plan of care. Advised patient to take medications according to the instructions on the label. Discussed possible side effects of new medications. Patient verbalized understanding.    FINAL CLINICAL IMPRESSION(S) / ED DIAGNOSES   Final diagnoses:  Allergic reaction, initial encounter  Insect bite of left upper arm, initial encounter      Rx / DC Orders   ED Discharge Orders          Ordered    cetirizine (ZYRTEC ALLERGY) 10 MG tablet  Daily        10/23/23 1857    predniSONE (DELTASONE) 50 MG tablet        10/23/23 1857             Note:  This document was prepared using Dragon voice recognition software and may include unintentional dictation errors.   Awilda Lennox, PA-C 10/23/23 2256    Shane Darling, MD 10/24/23 [phone]

## 2023-11-27 ENCOUNTER — Other Ambulatory Visit: Payer: Self-pay

## 2023-11-27 DIAGNOSIS — W450XXA Nail entering through skin, initial encounter: Secondary | ICD-10-CM | POA: Insufficient documentation

## 2023-11-27 DIAGNOSIS — S91332A Puncture wound without foreign body, left foot, initial encounter: Secondary | ICD-10-CM | POA: Insufficient documentation

## 2023-11-27 DIAGNOSIS — Z23 Encounter for immunization: Secondary | ICD-10-CM | POA: Insufficient documentation

## 2023-11-27 NOTE — ED Triage Notes (Signed)
 Pt reports she stepped on a nail that went through the arch of her left foot.

## 2023-11-28 ENCOUNTER — Emergency Department: Payer: Self-pay

## 2023-11-28 ENCOUNTER — Emergency Department
Admission: EM | Admit: 2023-11-28 | Discharge: 2023-11-28 | Disposition: A | Discharge status: Home / Self Care | Payer: Self-pay | Attending: Emergency Medicine | Admitting: Emergency Medicine

## 2023-11-28 DIAGNOSIS — S91332A Puncture wound without foreign body, left foot, initial encounter: Secondary | ICD-10-CM

## 2023-11-28 MED ORDER — CEPHALEXIN 500 MG PO CAPS: 500.0000 mg | ORAL_CAPSULE | Freq: Four times a day (QID) | ORAL | 0 refills | Status: AC

## 2023-11-28 MED ORDER — CEPHALEXIN 500 MG PO CAPS
500.0000 mg | ORAL_CAPSULE | Freq: Once | ORAL | Status: AC
  Administered 2023-11-28: 500 mg via ORAL
  Filled 2023-11-28: qty 1

## 2023-11-28 MED ORDER — IBUPROFEN 800 MG PO TABS
800.0000 mg | ORAL_TABLET | Freq: Once | ORAL | Status: AC
  Administered 2023-11-28: 800 mg via ORAL
  Filled 2023-11-28: qty 1

## 2023-11-28 MED ORDER — IBUPROFEN 800 MG PO TABS: 800.0000 mg | ORAL_TABLET | Freq: Three times a day (TID) | ORAL | 0 refills | Status: DC | PRN

## 2023-11-28 MED ORDER — TETANUS-DIPHTH-ACELL PERTUSSIS 5-2.5-18.5 LF-MCG/0.5 IM SUSY
0.5000 mL | PREFILLED_SYRINGE | Freq: Once | INTRAMUSCULAR | Status: AC
  Administered 2023-11-28: 0.5 mL via INTRAMUSCULAR
  Filled 2023-11-28: qty 0.5

## 2023-11-28 NOTE — ED Provider Notes (Signed)
 [facility] Provider Note    Event Date/Time   First MD Initiated Contact with Patient 11/28/23 0153     (approximate)   History   Foot Injury   HPI  Heather Newton is a 23 y.o. female with left foot pain after she stepped on a nail.  States the nail went through her the sole of her shoe and into her foot.  She states she pulled it out completely and does not think there is any residual foreign body.  She is unsure of her last tetanus vaccine.  She is not a diabetic.  She is able to bear weight.   History provided by patient.    History reviewed. No pertinent past medical history.  History reviewed. No pertinent surgical history.  MEDICATIONS:  Prior to Admission medications   Medication Sig Start Date End Date Taking? Authorizing Provider  cephALEXin (KEFLEX) 500 MG capsule Take 1 capsule (500 mg total) by mouth 4 (four) times daily for 7 days. 11/28/23 12/05/23 Yes Karly Pitter N, DO  ibuprofen  (ADVIL ) 800 MG tablet Take 1 tablet (800 mg total) by mouth every 8 (eight) hours as needed. 11/28/23  Yes Baneen Wieseler, Clover Dao, DO  cetirizine  (ZYRTEC  ALLERGY) 10 MG tablet Take 1 tablet (10 mg total) by mouth daily. 10/23/23   Awilda Lennox, PA-C  predniSONE  (DELTASONE ) 50 MG tablet Please take 1 with breakfast for 4 days 10/23/23   Awilda Lennox, PA-C    Physical Exam   Triage Vital Signs: ED Triage Vitals [11/27/23 2333]  Encounter Vitals Group     BP 131/82     Systolic BP Percentile      Diastolic BP Percentile      Pulse Rate 74     Resp 18     Temp 98.4 F (36.9 C)     Temp src      SpO2 98 %     Weight      Height      Head Circumference      Peak Flow      Pain Score 8     Pain Loc      Pain Education      Exclude from Growth Chart     Most recent vital signs: Vitals:   11/27/23 2333  BP: 131/82  Pulse: 74  Resp: 18  Temp: 98.4 F (36.9 C)  SpO2: 98%     CONSTITUTIONAL: Alert and responds appropriately to questions.  Well-appearing; well-nourished HEAD: Normocephalic, atraumatic EYES: Conjunctivae clear, pupils appear equal ENT: normal nose; moist mucous membranes NECK: Normal range of motion CARD: Regular rate and rhythm RESP: Normal chest excursion without splinting or tachypnea; no hypoxia or respiratory distress, speaking full sentences ABD/GI: non-distended EXT: Small puncture wound noted to the sole of the left foot without surrounding redness, warmth and no sign of residual foreign body.  No bleeding or purulent drainage.  2+ left radial pulse.  No bony tenderness or bony deformity noted.  Normal capillary refill. SKIN: Normal color for age and race, no rashes on exposed skin NEURO: Moves all extremities equally, normal speech, no facial asymmetry noted PSYCH: The patient's mood and manner are appropriate. Grooming and personal hygiene are appropriate.  ED Results / Procedures / Treatments   LABS: (all labs ordered are listed, but only abnormal results are displayed) Labs Reviewed - No data to display   EKG:  EKG Interpretation Date/Time:    Ventricular Rate:    PR Interval:  QRS Duration:    QT Interval:    QTC Calculation:   R Axis:      Text Interpretation:            RADIOLOGY: My personal review and interpretation of imaging: X-ray shows no fracture.  No foreign body.  I have personally reviewed all radiology reports. DG Foot Complete Left Result Date: 11/28/2023 CLINICAL DATA:  Recently stepped on nail with foot pain, initial encounter EXAM: LEFT FOOT - COMPLETE 3+ VIEW COMPARISON:  None Available. FINDINGS: There is no evidence of fracture or dislocation. There is no evidence of arthropathy or other focal bone abnormality. Soft tissues are unremarkable. IMPRESSION: No acute abnormality noted.  No foreign body is seen. Electronically Signed   By: Violeta Grey M.D.   On: 11/28/2023 00:57     PROCEDURES:  Critical Care performed: No    Procedures    IMPRESSION  / MDM / ASSESSMENT AND PLAN / ED COURSE  I reviewed the triage vital signs and the nursing notes.   Patient here with puncture wound to the left foot.  Nail went through the sole of her shoe into her foot.     DIFFERENTIAL DIAGNOSIS (includes but not limited to):   Puncture wound, no retained foreign body, doubt fracture, currently no sign of abscess or cellulitis  Patient's presentation is most consistent with acute complicated illness / injury requiring diagnostic workup.  PLAN: X-ray reviewed and interpreted by myself and the radiologist and shows no fracture or retained foreign body.  Will clean this wound, applied postoperative shoe.  Offered crutches but she declines.  She can weight-bear as tolerated.  Recommended alternating Tylenol, Motrin .  Patient does not want any narcotic pain medication.  Will start her on Keflex prophylactically given the nail went through the sole of her shoe.  She is not a diabetic.  Does not need Pseudomonas coverage currently.   MEDICATIONS GIVEN IN ED: Medications  cephALEXin (KEFLEX) capsule 500 mg (500 mg Oral Given 11/28/23 0243)  Tdap (BOOSTRIX) injection 0.5 mL (0.5 mLs Intramuscular Given 11/28/23 0243)  ibuprofen  (ADVIL ) tablet 800 mg (800 mg Oral Given 11/28/23 0244)     ED COURSE:  At this time, I do not feel there is any life-threatening condition present. I reviewed all nursing notes, vitals, pertinent previous records.  All lab and urine results, EKGs, imaging ordered have been independently reviewed and interpreted by myself.  I reviewed all available radiology reports from any imaging ordered this visit.  Based on my assessment, I feel the patient is safe to be discharged home without further emergent workup and can continue workup as an outpatient as needed. Discussed all findings, treatment plan as well as usual and customary return precautions.  They verbalize understanding and are comfortable with this plan.  Outpatient follow-up has been  provided as needed.  All questions have been answered.    CONSULTS:  none   OUTSIDE RECORDS REVIEWED: Reviewed last OB/GYN note in June 2023.     FINAL CLINICAL IMPRESSION(S) / ED DIAGNOSES   Final diagnoses:  Puncture wound of left foot, initial encounter     Rx / DC Orders   ED Discharge Orders          Ordered    cephALEXin (KEFLEX) 500 MG capsule  4 times daily        11/28/23 0230    ibuprofen  (ADVIL ) 800 MG tablet  Every 8 hours PRN        11/28/23 0230  Note:  This document was prepared using Dragon voice recognition software and may include unintentional dictation errors.   Carolle Ishii, Clover Dao, DO 11/28/23 [phone]

## 2023-11-28 NOTE — Discharge Instructions (Addendum)
You may alternate Tylenol 1000 mg every 6 hours as needed for pain, fever and Ibuprofen 800 mg every 6-8 hours as needed for pain, fever.  Please take Ibuprofen with food.  Do not take more than 4000 mg of Tylenol (acetaminophen) in a 24 hour period.  Steps to find a Primary Care Provider (PCP):  Call [phone] or [phone] to access "[place] Find a Doctor Service."  2.  You may also go on the [place] website at [email or web address]  

## 2023-11-28 NOTE — ED Notes (Signed)
 Pt c/o stepping on piece of wood with nail in it with L foot, unable to bear weight due to pain, last tetanus shot was over 10 years ago.

## 2023-11-28 NOTE — ED Notes (Signed)
 Left foot wound cleaned and bandaged.  Post op shoe applied to left foot  d/c inst to pt.

## 2023-12-02 ENCOUNTER — Emergency Department
Admission: EM | Admit: 2023-12-02 | Discharge: 2023-12-02 | Discharge status: Against Medical Advice | Payer: Self-pay | Attending: Emergency Medicine | Admitting: Emergency Medicine

## 2023-12-02 ENCOUNTER — Encounter: Payer: Self-pay | Admitting: Emergency Medicine

## 2023-12-02 ENCOUNTER — Other Ambulatory Visit: Payer: Self-pay

## 2023-12-02 DIAGNOSIS — Z0279 Encounter for issue of other medical certificate: Secondary | ICD-10-CM | POA: Insufficient documentation

## 2023-12-02 DIAGNOSIS — Z5321 Procedure and treatment not carried out due to patient leaving prior to being seen by health care provider: Secondary | ICD-10-CM | POA: Insufficient documentation

## 2023-12-02 NOTE — ED Triage Notes (Signed)
 Pt via POV from home. Pt was seen on 6/10 for a foot injury states she needs longer out of work because she is still unable to walk on her L foot and she works as a Child psychotherapist. States she needs a work note for today. States no follow-up or referrals were given. Pt is A&Ox4 and NAD, ambulatory to triage.

## 2024-01-05 DIAGNOSIS — M5442 Lumbago with sciatica, left side: Secondary | ICD-10-CM | POA: Insufficient documentation

## 2024-01-05 DIAGNOSIS — R350 Frequency of micturition: Secondary | ICD-10-CM | POA: Insufficient documentation

## 2024-01-06 ENCOUNTER — Emergency Department
Admission: EM | Admit: 2024-01-06 | Discharge: 2024-01-06 | Disposition: A | Discharge status: Home / Self Care | Payer: Self-pay | Attending: Emergency Medicine | Admitting: Emergency Medicine

## 2024-01-06 ENCOUNTER — Other Ambulatory Visit: Payer: Self-pay

## 2024-01-06 ENCOUNTER — Encounter: Payer: Self-pay | Admitting: Emergency Medicine

## 2024-01-06 DIAGNOSIS — M5442 Lumbago with sciatica, left side: Secondary | ICD-10-CM

## 2024-01-06 LAB — URINALYSIS, ROUTINE W REFLEX MICROSCOPIC
Bilirubin Urine: NEGATIVE
Glucose, UA: NEGATIVE mg/dL
Hgb urine dipstick: NEGATIVE
Ketones, ur: NEGATIVE mg/dL
Leukocytes,Ua: NEGATIVE
Nitrite: NEGATIVE
Protein, ur: NEGATIVE mg/dL
Specific Gravity, Urine: 1.029 (ref 1.005–1.030)
pH: 5 (ref 5.0–8.0)

## 2024-01-06 LAB — POC URINE PREG, ED
Preg Test, Ur: NEGATIVE
Preg Test, Ur: NEGATIVE

## 2024-01-06 NOTE — ED Notes (Signed)
Urine sample sent to lab at this time ?

## 2024-01-06 NOTE — Discharge Instructions (Signed)
 You can start with simple medications like Tylenol and ibuprofen  twice daily to help with symptoms.  Please follow-up with your primary care provider or return for any worsening symptoms.  I suspect this is likely a strain of your lower back.  No sign of infection.

## 2024-01-06 NOTE — ED Triage Notes (Signed)
 Pt reports left lower back pain and urinary urgency/frequency x 1.5 days

## 2024-01-06 NOTE — ED Provider Notes (Signed)
 [facility] Provider Note    Event Date/Time   First MD Initiated Contact with Patient 01/06/24 [phone]     (approximate)   History   Back Pain and Urinary Frequency   HPI Heather Newton is a 23 y.o. female presenting today for low back pain and urinary frequency.  Patient states she has a history of chronic low back pain and has been lifting more in the past week.  Stated exacerbation of the pain in her left lower back.  Denies any trauma.  Also having increased urinary frequency but denies any dysuria or hematuria.  No other abdominal pain, nausea, vomiting, constipation.     Physical Exam   Triage Vital Signs: ED Triage Vitals  Encounter Vitals Group     BP 01/06/24 0009 123/83     Girls Systolic BP Percentile --      Girls Diastolic BP Percentile --      Boys Systolic BP Percentile --      Boys Diastolic BP Percentile --      Pulse Rate 01/06/24 0009 85     Resp 01/06/24 0009 16     Temp 01/06/24 0009 98.9 F (37.2 C)     Temp Source 01/06/24 0009 Oral     SpO2 01/06/24 0009 99 %     Weight 01/06/24 0013 256 lb 6.3 oz (116.3 kg)     Height 01/06/24 0013 5' 9 (1.753 m)     Head Circumference --      Peak Flow --      Pain Score 01/06/24 0009 8     Pain Loc --      Pain Education --      Exclude from Growth Chart --     Most recent vital signs: Vitals:   01/06/24 0009  BP: 123/83  Pulse: 85  Resp: 16  Temp: 98.9 F (37.2 C)  SpO2: 99%   I have reviewed the vital signs. General:  Awake, alert, no acute distress. Head:  Normocephalic, Atraumatic. EENT:  PERRL, EOMI, Oral mucosa pink and moist, Neck is supple. Cardiovascular: Regular rate, 2+ distal pulses. Respiratory:  Normal respiratory effort, symmetrical expansion, no distress.   Extremities:  Moving all four extremities through full ROM without pain.  Mild tenderness palpation in the left lower back.  No midline tenderness to palpation. Neuro:  Alert and oriented.  Interacting  appropriately.   Skin:  Warm, dry, no rash.   Psych: Appropriate affect.    ED Results / Procedures / Treatments   Labs (all labs ordered are listed, but only abnormal results are displayed) Labs Reviewed  URINALYSIS, ROUTINE W REFLEX MICROSCOPIC - Abnormal; Notable for the following components:      Result Value   Color, Urine YELLOW (*)    APPearance HAZY (*)    All other components within normal limits  POC URINE PREG, ED     EKG    RADIOLOGY    PROCEDURES:  Critical Care performed: No  Procedures   MEDICATIONS ORDERED IN ED: Medications - No data to display   IMPRESSION / MDM / ASSESSMENT AND PLAN / ED COURSE  I reviewed the triage vital signs and the nursing notes.                              Differential diagnosis includes, but is not limited to, muscular strain, acute on chronic low back pain, UTI  Patient's presentation  is most consistent with acute complicated illness / injury requiring diagnostic workup.  Patient is a 23 year old female presenting today for polyuria and acute on chronic low back pain.  Her low back pain seems consistent with musculoskeletal strain in the setting of increased lifting.  No other acute neurological symptoms present.  Laboratory workup with negative pregnancy test and no signs of a UA.  Offered pain medications here but patient preferred to go home with Tylenol and ibuprofen  as needed.  Otherwise safe for discharge and given strict return precautions.     FINAL CLINICAL IMPRESSION(S) / ED DIAGNOSES   Final diagnoses:  Acute left-sided low back pain with left-sided sciatica     Rx / DC Orders   ED Discharge Orders     None        Note:  This document was prepared using Dragon voice recognition software and may include unintentional dictation errors.   Malvina Alm DASEN, MD 01/06/24 [phone]

## 2024-01-09 ENCOUNTER — Other Ambulatory Visit: Payer: Self-pay

## 2024-01-09 ENCOUNTER — Emergency Department
Admission: EM | Admit: 2024-01-09 | Discharge: 2024-01-09 | Disposition: A | Discharge status: Home / Self Care | Payer: Self-pay | Attending: Emergency Medicine | Admitting: Emergency Medicine

## 2024-01-09 DIAGNOSIS — N390 Urinary tract infection, site not specified: Secondary | ICD-10-CM | POA: Insufficient documentation

## 2024-01-09 LAB — URINALYSIS, ROUTINE W REFLEX MICROSCOPIC
Bilirubin Urine: NEGATIVE
Glucose, UA: NEGATIVE mg/dL
Hgb urine dipstick: NEGATIVE
Ketones, ur: NEGATIVE mg/dL
Nitrite: NEGATIVE
Protein, ur: 30 mg/dL — AB
Specific Gravity, Urine: 1.033 — ABNORMAL HIGH (ref 1.005–1.030)
pH: 5 (ref 5.0–8.0)

## 2024-01-09 LAB — POC URINE PREG, ED: Preg Test, Ur: NEGATIVE

## 2024-01-09 MED ORDER — PHENAZOPYRIDINE HCL 100 MG PO TABS
95.0000 mg | ORAL_TABLET | Freq: Once | ORAL | Status: AC
  Administered 2024-01-09: 100 mg via ORAL
  Filled 2024-01-09: qty 1

## 2024-01-09 MED ORDER — PHENAZOPYRIDINE HCL 95 MG PO TABS: 95.0000 mg | ORAL_TABLET | Freq: Three times a day (TID) | ORAL | 0 refills | Status: DC | PRN

## 2024-01-09 MED ORDER — IBUPROFEN 800 MG PO TABS
800.0000 mg | ORAL_TABLET | Freq: Once | ORAL | Status: AC
  Administered 2024-01-09: 800 mg via ORAL
  Filled 2024-01-09: qty 1

## 2024-01-09 MED ORDER — CEPHALEXIN 500 MG PO CAPS: 500.0000 mg | ORAL_CAPSULE | Freq: Two times a day (BID) | ORAL | 0 refills | Status: DC

## 2024-01-09 MED ORDER — CEPHALEXIN 500 MG PO CAPS
500.0000 mg | ORAL_CAPSULE | Freq: Once | ORAL | Status: AC
  Administered 2024-01-09: 500 mg via ORAL
  Filled 2024-01-09: qty 1

## 2024-01-09 NOTE — Discharge Instructions (Signed)
 You may alternate over the counter Tylenol 1000 mg every 6 hours as needed for pain, fever and Ibuprofen 800 mg every 6-8 hours as needed for pain, fever.  Please take Ibuprofen with food.  Do not take more than 4000 mg of Tylenol (acetaminophen) in a 24 hour period.

## 2024-01-09 NOTE — ED Triage Notes (Signed)
 Pt to ED via POV c/o urinary urgency/frequency. Has been going on for a few days. Was seen here on the 18th for the same. Denies abd pain, N/V/D,  CP, SOB, fevers

## 2024-01-09 NOTE — ED Provider Notes (Signed)
 [facility] Provider Note    Event Date/Time   First MD Initiated Contact with Patient 01/09/24 [phone]     (approximate)   History   Urinary Frequency   HPI  Heather Newton is a 23 y.o. female who presents to the emergency department with dysuria, urinary frequency, urgency, lower back pain.  No fevers, nausea or vomiting, diarrhea, flank pain.  No abnormal vaginal bleeding or discharge.  She is concerned she has a UTI.  States she was seen in the emergency department on 01/06/2024 and was diagnosed with musculoskeletal back pain.   History provided by patient.    History reviewed. No pertinent past medical history.  History reviewed. No pertinent surgical history.  MEDICATIONS:  Prior to Admission medications   Medication Sig Start Date End Date Taking? Authorizing Provider  cetirizine  (ZYRTEC  ALLERGY) 10 MG tablet Take 1 tablet (10 mg total) by mouth daily. 10/23/23   Evans, Alexandra, PA-C  ibuprofen  (ADVIL ) 800 MG tablet Take 1 tablet (800 mg total) by mouth every 8 (eight) hours as needed. 11/28/23   Rayshard Schirtzinger, Josette SAILOR, DO  predniSONE  (DELTASONE ) 50 MG tablet Please take 1 with breakfast for 4 days 10/23/23   Janit Kast, PA-C    Physical Exam   Triage Vital Signs: ED Triage Vitals  Encounter Vitals Group     BP 01/09/24 0252 (!) 111/55     Girls Systolic BP Percentile --      Girls Diastolic BP Percentile --      Boys Systolic BP Percentile --      Boys Diastolic BP Percentile --      Pulse Rate 01/09/24 0252 77     Resp 01/09/24 0252 16     Temp 01/09/24 0252 98.4 F (36.9 C)     Temp src --      SpO2 01/09/24 0252 100 %     Weight --      Height --      Head Circumference --      Peak Flow --      Pain Score 01/09/24 0251 0     Pain Loc --      Pain Education --      Exclude from Growth Chart --     Most recent vital signs: Vitals:   01/09/24 0252 01/09/24 0513  BP: (!) 111/55 105/65  Pulse: 77 70  Resp: 16 18  Temp: 98.4  F (36.9 C)   SpO2: 100% 100%    CONSTITUTIONAL: Alert, responds appropriately to questions. Well-appearing; well-nourished HEAD: Normocephalic, atraumatic EYES: Conjunctivae clear, pupils appear equal, sclera nonicteric ENT: normal nose; moist mucous membranes NECK: Supple, normal ROM CARD: RRR; S1 and S2 appreciated RESP: Normal chest excursion without splinting or tachypnea; breath sounds clear and equal bilaterally; no wheezes, no rhonchi, no rales, no hypoxia or respiratory distress, speaking full sentences ABD/GI: Non-distended; soft, non-tender, no rebound, no guarding, no peritoneal signs BACK: The back appears normal EXT: Normal ROM in all joints; no deformity noted, no edema SKIN: Normal color for age and race; warm; no rash on exposed skin NEURO: Moves all extremities equally, normal speech PSYCH: The patient's mood and manner are appropriate.   ED Results / Procedures / Treatments   LABS: (all labs ordered are listed, but only abnormal results are displayed) Labs Reviewed  URINALYSIS, ROUTINE W REFLEX MICROSCOPIC - Abnormal; Notable for the following components:      Result Value   Color, Urine YELLOW (*)  APPearance HAZY (*)    Specific Gravity, Urine 1.033 (*)    Protein, ur 30 (*)    Leukocytes,Ua MODERATE (*)    Bacteria, UA RARE (*)    All other components within normal limits  URINE CULTURE  POC URINE PREG, ED     EKG:  EKG Interpretation Date/Time:    Ventricular Rate:    PR Interval:    QRS Duration:    QT Interval:    QTC Calculation:   R Axis:      Text Interpretation:           RADIOLOGY: My personal review and interpretation of imaging:    I have personally reviewed all radiology reports.   No results found.   PROCEDURES:  Critical Care performed: No    Procedures    IMPRESSION / MDM / ASSESSMENT AND PLAN / ED COURSE  I reviewed the triage vital signs and the nursing notes.    Patient here with urinary symptoms,  low back pain.  The patient is on the cardiac monitor to evaluate for evidence of arrhythmia and/or significant heart rate changes.   DIFFERENTIAL DIAGNOSIS (includes but not limited to):   UTI, kidney stone, pyelonephritis, musculoskeletal pain, doubt cauda equina, epidural abscess or hematoma, discitis or osteomyelitis, fracture   Patient's presentation is most consistent with acute complicated illness / injury requiring diagnostic workup.   PLAN: Urinalysis shows red blood cells, white blood cells, bacteria.  Given complaints of dysuria with urinary frequency, urgency, suspect UTI.  Low suspicion for kidney stone, pyelonephritis.  Pregnancy test negative.  Abdominal exam benign.  Will start patient on antibiotics.  Recommended Tylenol, ibuprofen , Azo as needed for discomfort.   MEDICATIONS GIVEN IN ED: Medications  ibuprofen  (ADVIL ) tablet 800 mg (800 mg Oral Given 01/09/24 0509)  phenazopyridine  (PYRIDIUM ) tablet 100 mg (100 mg Oral Given 01/09/24 0510)  cephALEXin  (KEFLEX ) capsule 500 mg (500 mg Oral Given 01/09/24 0509)     ED COURSE:  At this time, I do not feel there is any life-threatening condition present. I reviewed all nursing notes, vitals, pertinent previous records.  All lab and urine results, EKGs, imaging ordered have been independently reviewed and interpreted by myself.  I reviewed all available radiology reports from any imaging ordered this visit.  Based on my assessment, I feel the patient is safe to be discharged home without further emergent workup and can continue workup as an outpatient as needed. Discussed all findings, treatment plan as well as usual and customary return precautions.  They verbalize understanding and are comfortable with this plan.  Outpatient follow-up has been provided as needed.  All questions have been answered.    CONSULTS:  none   OUTSIDE RECORDS REVIEWED: Reviewed last OB/GYN note June 2023.       FINAL CLINICAL IMPRESSION(S) / ED  DIAGNOSES   Final diagnoses:  Acute UTI     Rx / DC Orders   ED Discharge Orders          Ordered    cephALEXin  (KEFLEX ) 500 MG capsule  2 times daily        01/09/24 0450    phenazopyridine  (PYRIDIUM ) 95 MG tablet  3 times daily PRN        01/09/24 0450    Ambulatory Referral to Primary Care (Establish Care)        01/09/24 0450             Note:  This document was prepared using Dragon voice  recognition software and may include unintentional dictation errors.   Norbert Malkin, Josette SAILOR, DO 01/09/24 [phone]

## 2024-01-10 LAB — URINE CULTURE: Culture: <10000 — AB

## 2024-01-23 ENCOUNTER — Emergency Department
Admission: EM | Admit: 2024-01-23 | Discharge: 2024-01-23 | Discharge status: Against Medical Advice | Payer: Self-pay | Attending: Emergency Medicine | Admitting: Emergency Medicine

## 2024-01-23 DIAGNOSIS — Z5321 Procedure and treatment not carried out due to patient leaving prior to being seen by health care provider: Secondary | ICD-10-CM | POA: Insufficient documentation

## 2024-01-23 NOTE — ED Triage Notes (Signed)
 Pt presents wanting a pregnancy test.  Sts she took 5 at home and all were positive.

## 2024-01-23 NOTE — ED Notes (Signed)
 As soon as Triage process was complete, Pt reported that they are going to leave.  Sts she will make an appointment with her provider.  Pt informed the EDP may order additional testing if they want to stay, but she was adamant that she wants to leave.

## 2024-01-26 ENCOUNTER — Ambulatory Visit: Payer: Self-pay

## 2024-01-31 ENCOUNTER — Other Ambulatory Visit: Payer: Self-pay | Admitting: Family Medicine

## 2024-01-31 ENCOUNTER — Ambulatory Visit (LOCAL_COMMUNITY_HEALTH_CENTER): Payer: Self-pay

## 2024-01-31 VITALS — BP 118/56 | Ht 68.0 in | Wt 269.5 lb

## 2024-01-31 DIAGNOSIS — O26899 Other specified pregnancy related conditions, unspecified trimester: Secondary | ICD-10-CM

## 2024-01-31 DIAGNOSIS — Z3009 Encounter for other general counseling and advice on contraception: Secondary | ICD-10-CM

## 2024-01-31 DIAGNOSIS — Z3201 Encounter for pregnancy test, result positive: Secondary | ICD-10-CM

## 2024-01-31 DIAGNOSIS — Z8759 Personal history of other complications of pregnancy, childbirth and the puerperium: Secondary | ICD-10-CM

## 2024-01-31 LAB — PREGNANCY, URINE: Preg Test, Ur: POSITIVE — AB

## 2024-01-31 MED ORDER — PRENATAL 27-0.8 MG PO TABS: 1.0000 | ORAL_TABLET | Freq: Every day | ORAL | Status: AC

## 2024-01-31 NOTE — Progress Notes (Signed)
 Positive pregnancy test, hx ectopic pregnancies, reporting severe cramping yesterday. Better today. Will order asap OB US  w. Transvaginal. ED precautions to be given by RN.   Dorothyann Helling, MD 01/31/24  9:13 AM

## 2024-01-31 NOTE — Progress Notes (Addendum)
 UPT positive. Pt stated she was having some pretty severe cramping yesterday. Denies any bleeding. Stated she has a history ectopic pregnancies and miscarriage as well. Consulted with provider ,recommendations were for pt to go to the ER for eval . Discussed also contacting [facility] she [facility] to [facility] for prenatal care to let them know her history to see if can get in sooner. Maternity nursing coordinator also spoke with pt. Pt was sent to clerical for medicaid for pregnancy eligibility.  The patient was dispensed PNV #100 tabs today. I provided counseling today regarding the medication. We discussed the medication, the side effects and when to call clinic. Patient given the opportunity to ask questions. Questions answered.

## 2024-02-01 ENCOUNTER — Inpatient Hospital Stay (HOSPITAL_COMMUNITY)
Admission: AD | Admit: 2024-02-01 | Discharge: 2024-02-01 | Disposition: A | Discharge status: Home / Self Care | Attending: Obstetrics and Gynecology | Admitting: Obstetrics and Gynecology

## 2024-02-01 ENCOUNTER — Other Ambulatory Visit: Payer: Self-pay

## 2024-02-01 ENCOUNTER — Encounter (HOSPITAL_COMMUNITY): Payer: Self-pay | Admitting: Obstetrics and Gynecology

## 2024-02-01 ENCOUNTER — Inpatient Hospital Stay (HOSPITAL_COMMUNITY)

## 2024-02-01 DIAGNOSIS — F17290 Nicotine dependence, other tobacco product, uncomplicated: Secondary | ICD-10-CM | POA: Insufficient documentation

## 2024-02-01 DIAGNOSIS — O26891 Other specified pregnancy related conditions, first trimester: Secondary | ICD-10-CM

## 2024-02-01 DIAGNOSIS — R109 Unspecified abdominal pain: Secondary | ICD-10-CM | POA: Diagnosis not present

## 2024-02-01 DIAGNOSIS — Z3A01 Less than 8 weeks gestation of pregnancy: Secondary | ICD-10-CM | POA: Diagnosis not present

## 2024-02-01 DIAGNOSIS — O99331 Smoking (tobacco) complicating pregnancy, first trimester: Secondary | ICD-10-CM | POA: Diagnosis not present

## 2024-02-01 DIAGNOSIS — B9689 Other specified bacterial agents as the cause of diseases classified elsewhere: Secondary | ICD-10-CM | POA: Diagnosis not present

## 2024-02-01 DIAGNOSIS — O23591 Infection of other part of genital tract in pregnancy, first trimester: Secondary | ICD-10-CM | POA: Insufficient documentation

## 2024-02-01 DIAGNOSIS — O23592 Infection of other part of genital tract in pregnancy, second trimester: Secondary | ICD-10-CM | POA: Diagnosis not present

## 2024-02-01 LAB — ABO/RH: ABO/RH(D): A POS

## 2024-02-01 LAB — CBC
HCT: 35.8 % — ABNORMAL LOW (ref 36.0–46.0)
Hemoglobin: 11.8 g/dL — ABNORMAL LOW (ref 12.0–15.0)
MCH: 28.8 pg (ref 26.0–34.0)
MCHC: 33 g/dL (ref 30.0–36.0)
MCV: 87.3 fL (ref 80.0–100.0)
Platelets: 312 K/uL (ref 150–400)
RBC: 4.1 MIL/uL (ref 3.87–5.11)
RDW: 12.6 % (ref 11.5–15.5)
WBC: 7.5 K/uL (ref 4.0–10.5)
nRBC: 0 % (ref 0.0–0.2)

## 2024-02-01 LAB — WET PREP, GENITAL
Sperm: NONE SEEN
Trich, Wet Prep: NONE SEEN
WBC, Wet Prep HPF POC: <10 (ref <10)
Yeast Wet Prep HPF POC: NONE SEEN

## 2024-02-01 LAB — HCG, QUANTITATIVE, PREGNANCY: hCG, Beta Chain, Quant, S: 5737 m[IU]/mL — ABNORMAL HIGH (ref <5)

## 2024-02-01 MED ORDER — METRONIDAZOLE 500 MG PO TABS: 500.0000 mg | ORAL_TABLET | Freq: Two times a day (BID) | ORAL | 0 refills | Status: AC

## 2024-02-01 NOTE — MAU Provider Note (Signed)
 History     CSN: [id number]  Arrival date and time: 02/01/24 1625   None     No chief complaint on file.  HPI  Heather Newton is  23 y.o. female G3P0020 @ 5 weeks here in MAU reporting abdominal pain and spotting. She reports very scant spotting. The symptoms started 1 hour prior to coming into MAU. Currently she reports lower abdominal pain that wraps around to her lower back. She has not taken anything for the pain.   Recent sex was yesterday.   OB History     Gravida  3   Para      Term      Preterm      AB  2   Living         SAB      IAB      Ectopic  2   Multiple      Live Births              Past Medical History:  Diagnosis Date   Patient denies medical problems     No past surgical history on file.  No family history on file.  Social History   Tobacco Use   Smoking status: Former    Current packs/day: 0.00    Types: Cigarettes    Quit date: 01/30/2022    Years since quitting: 2.0   Smokeless tobacco: Never  Vaping Use   Vaping status: Every Day   Substances: Nicotine  Substance Use Topics   Alcohol use: No   Drug use: Never    Allergies: No Known Allergies  Medications Prior to Admission  Medication Sig Dispense Refill Last Dose/Taking   cephALEXin  (KEFLEX ) 500 MG capsule Take 1 capsule (500 mg total) by mouth 2 (two) times daily. 14 capsule 0    cetirizine  (ZYRTEC  ALLERGY) 10 MG tablet Take 1 tablet (10 mg total) by mouth daily. 15 tablet 0    ibuprofen  (ADVIL ) 800 MG tablet Take 1 tablet (800 mg total) by mouth every 8 (eight) hours as needed. 30 tablet 0    phenazopyridine  (PYRIDIUM ) 95 MG tablet Take 1 tablet (95 mg total) by mouth 3 (three) times daily as needed for pain. 10 tablet 0    predniSONE  (DELTASONE ) 50 MG tablet Please take 1 with breakfast for 4 days 4 tablet 0    Prenatal Vit-Fe Fumarate-FA (MULTIVITAMIN-PRENATAL) 27-0.8 MG TABS tablet Take 1 tablet by mouth daily at 12 noon.      Results for orders  placed or performed during the hospital encounter of 02/01/24 (from the past 48 hours)  Wet prep, genital     Status: Abnormal   Collection Time: 02/01/24  4:56 PM   Specimen: PATH Cytology Cervicovaginal Ancillary Only  Result Value Ref Range   Yeast Wet Prep HPF POC NONE SEEN NONE SEEN   Trich, Wet Prep NONE SEEN NONE SEEN   Clue Cells Wet Prep HPF POC PRESENT (A) NONE SEEN   WBC, Wet Prep HPF POC <10 <10   Sperm NONE SEEN     Comment: Performed at [facility], [address], [place], [place] [address]  CBC     Status: Abnormal   Collection Time: 02/01/24  5:07 PM  Result Value Ref Range   WBC 7.5 4.0 - 10.5 K/uL   RBC 4.10 3.87 - 5.11 MIL/uL   Hemoglobin 11.8 (L) 12.0 - 15.0 g/dL   HCT 64.1 (L) 63.9 - 53.9 %   MCV 87.3 80.0 -  100.0 fL   MCH 28.8 26.0 - 34.0 pg   MCHC 33.0 30.0 - 36.0 g/dL   RDW 87.3 88.4 - 84.4 %   Platelets 312 150 - 400 K/uL   nRBC 0.0 0.0 - 0.2 %    Comment: Performed at [facility], [address], [place], [place] [address]  ABO/Rh     Status: None   Collection Time: 02/01/24  5:07 PM  Result Value Ref Range   ABO/RH(D) A POS    No rh immune globuloin      NOT A RH IMMUNE GLOBULIN CANDIDATE, PT RH POSITIVE Performed at [facility], [address], [place], [place] [address]   hCG, quantitative, pregnancy     Status: Abnormal   Collection Time: 02/01/24  5:07 PM  Result Value Ref Range   hCG, Beta Chain, Quant, S 5,737 (H) <5 mIU/mL    Comment:          GEST. AGE      CONC.  (mIU/mL)   <=1 WEEK        5 - 50     2 WEEKS       50 - 500     3 WEEKS       100 - 10,000     4 WEEKS     1,000 - 30,000     5 WEEKS     3,500 - 115,000   6-8 WEEKS     12,000 - 270,000    12 WEEKS     15,000 - 220,000        FEMALE AND NON-PREGNANT FEMALE:     LESS THAN 5 mIU/mL Performed at [facility], [address], [place], [place] [address]      US  OB LESS THAN 14 WEEKS WITH OB TRANSVAGINAL Result Date: 02/01/2024 CLINICAL DATA:   Pelvic pain. EXAM: OBSTETRIC <14 WK US  AND TRANSVAGINAL OB US  TECHNIQUE: Both transabdominal and transvaginal ultrasound examinations were performed for complete evaluation of the gestation as well as the maternal uterus, adnexal regions, and pelvic cul-de-sac. Transvaginal technique was performed to assess early pregnancy. COMPARISON:  June 28, 2016 FINDINGS: Intrauterine gestational sac: Single Yolk sac:  Visualized. Embryo:  Not Visualized. Cardiac Activity: Not Visualized. Heart Rate: N/A  bpm MSD: 6.7 mm   5 w   3 d Subchorionic hemorrhage:  None visualized. Maternal uterus/adnexae: The right ovary measures 3.2 cm x 2.0 cm x 2.1 cm and is normal in appearance. The left ovary measures 1.7 cm x 1.2 cm x 1.2 cm and is normal in appearance. No pelvic free fluid is seen. IMPRESSION: Probable early intrauterine gestational sac and yolk sac, but no fetal pole or cardiac activity yet visualized. Recommend follow-up quantitative B-HCG levels and follow-up US  in 14 days to assess viability. This recommendation follows SRU consensus guidelines: Diagnostic Criteria for Nonviable Pregnancy Early in the First Trimester. Heather Newton Med 2013; 630:8556-48. Electronically Signed   By: Suzen Dials M.D.   On: 02/01/2024 19:10     Review of Systems  Constitutional:  Negative for fever.  Gastrointestinal:  Positive for abdominal pain.  Genitourinary:  Positive for vaginal bleeding.   Physical Exam   Last menstrual period 12/26/2023.  Physical Exam Constitutional:      General: She is not in acute distress.    Appearance: She is well-developed. She is not ill-appearing, toxic-appearing or diaphoretic.  Abdominal:     Tenderness: There is no abdominal tenderness.  There is no guarding or rebound.  Skin:    General: Skin is warm.  Neurological:     Mental Status: She is alert.    MAU Course  Procedures  MDM  Wet prep & GC HIV, CBC, Hcg, ABO US  OB transvaginal    Assessment and Plan   A:  1.  Abdominal pain during pregnancy in first trimester   2. Bacterial vaginosis     P  Dc home  Return to MAU if symptoms  Rx: Flagyl   F/u US  in 7-10 days. Message sent to [facility] to schedule f/u US   Zaray Gatchel, Delon FERNS, NP 02/01/2024 8:28 PM

## 2024-02-02 ENCOUNTER — Emergency Department
Admission: EM | Admit: 2024-02-02 | Discharge: 2024-02-02 | Disposition: A | Discharge status: Home / Self Care | Attending: Emergency Medicine | Admitting: Emergency Medicine

## 2024-02-02 ENCOUNTER — Other Ambulatory Visit: Payer: Self-pay

## 2024-02-02 ENCOUNTER — Encounter

## 2024-02-02 DIAGNOSIS — Z3A01 Less than 8 weeks gestation of pregnancy: Secondary | ICD-10-CM | POA: Insufficient documentation

## 2024-02-02 DIAGNOSIS — O26891 Other specified pregnancy related conditions, first trimester: Secondary | ICD-10-CM | POA: Diagnosis present

## 2024-02-02 DIAGNOSIS — O99711 Diseases of the skin and subcutaneous tissue complicating pregnancy, first trimester: Secondary | ICD-10-CM | POA: Insufficient documentation

## 2024-02-02 DIAGNOSIS — L239 Allergic contact dermatitis, unspecified cause: Secondary | ICD-10-CM | POA: Insufficient documentation

## 2024-02-02 LAB — GC/CHLAMYDIA PROBE AMP (~~LOC~~) NOT AT ARMC
Chlamydia: NEGATIVE
Comment: NEGATIVE
Comment: NORMAL
Neisseria Gonorrhea: NEGATIVE

## 2024-02-02 MED ORDER — TRIAMCINOLONE ACETONIDE 0.025 % EX OINT: 1.0000 | TOPICAL_OINTMENT | Freq: Two times a day (BID) | CUTANEOUS | 0 refills | Status: AC

## 2024-02-02 NOTE — ED Triage Notes (Signed)
 Pt in via pov from home, reports she is almost [redacted] weeks pregnant, c/o or rash across chest/breasts, groin, legs and arms that itches badly

## 2024-02-02 NOTE — ED Provider Notes (Signed)
 [facility] Provider Note   Event Date/Time   First MD Initiated Contact with Patient 02/02/24 2300     (approximate) History  Rash  HPI Heather Newton is a 23 y.o. female with stated past medical history of 6 weeks pregnancy who presents complaining of of a pruritic rash that started on her chest and now has spread to underneath both breasts as well as in bilateral inguinal regions and groin.  Patient states no new topical creams, ointments, detergents, body wash, or any new soaps.  Patient states her only allergy is to pickles she has not had any exposure in these areas.  Denies any fevers.  Patient has not been using any medications for this rash.  ROS: Patient currently denies any vision changes, tinnitus, difficulty speaking, facial droop, sore throat, chest pain, shortness of breath, abdominal pain, nausea/vomiting/diarrhea, dysuria, or weakness/numbness/paresthesias in any extremity   Physical Exam  Triage Vital Signs: ED Triage Vitals  Encounter Vitals Group     BP 02/02/24 2138 123/64     Girls Systolic BP Percentile --      Girls Diastolic BP Percentile --      Boys Systolic BP Percentile --      Boys Diastolic BP Percentile --      Pulse Rate 02/02/24 2138 99     Resp 02/02/24 2138 17     Temp 02/02/24 2138 98.5 F (36.9 C)     Temp Source 02/02/24 2138 Oral     SpO2 02/02/24 2138 100 %     Weight 02/02/24 2139 269 lb (122 kg)     Height 02/02/24 2139 5' 8 (1.727 m)     Head Circumference --      Peak Flow --      Pain Score 02/02/24 2139 8     Pain Loc --      Pain Education --      Exclude from Growth Chart --    Most recent vital signs: Vitals:   02/02/24 2138  BP: 123/64  Pulse: 99  Resp: 17  Temp: 98.5 F (36.9 C)  SpO2: 100%   General: Awake, oriented x4. CV:  Good peripheral perfusion. Resp:  Normal effort. Abd:  No distention. Other:  Young adult morbidly obese Caucasian female resting comfortably in no acute distress.   There is scattered area of of erythematous maculopapular rash in the central chest, underneath both breasts, and in bilateral inguinal regions ED Results / Procedures / Treatments  Labs (all labs ordered are listed, but only abnormal results are displayed) Labs Reviewed - No data to display PROCEDURES: Critical Care performed: No Procedures MEDICATIONS ORDERED IN ED: Medications - No data to display IMPRESSION / MDM / ASSESSMENT AND PLAN / ED COURSE  I reviewed the triage vital signs and the nursing notes.                             The patient is on the cardiac monitor to evaluate for evidence of arrhythmia and/or significant heart rate changes. Patient's presentation is most consistent with acute presentation with potential threat to life or bodily function. Patient is non-toxic appearing and well hydrated. Ddx: Patient's symptoms not typical for other emergent causes of rash such as cellulitis, abscess, necrotizing fasciitis, vasculitis, anaphylaxis, SJS or TENS. Rx: Triamcinolone  ointment.  Patient was cautioned about the potential risks versus benefits during pregnancy and expresses understanding on the use of this medication Disposition:  Patient will be discharged with strict return precautions and follow up with pediatrician within 24-48 hours for further evaluation.   FINAL CLINICAL IMPRESSION(S) / ED DIAGNOSES   Final diagnoses:  Allergic contact dermatitis, unspecified trigger   Rx / DC Orders   ED Discharge Orders          Ordered    triamcinolone  (KENALOG ) 0.025 % ointment  2 times daily        02/02/24 2317           Note:  This document was prepared using Dragon voice recognition software and may include unintentional dictation errors.   Sommer Spickard K, MD 02/02/24 [phone]

## 2024-02-04 ENCOUNTER — Telehealth: Admitting: Family Medicine

## 2024-02-04 NOTE — Progress Notes (Signed)
 Pt did not show for visit [facility]

## 2024-02-05 ENCOUNTER — Telehealth: Payer: Self-pay | Admitting: Family Medicine

## 2024-02-05 NOTE — Telephone Encounter (Signed)
 Called patient and let her know about US  that was scheduled for her on 8/21. I let the patient know to arrive by 4:45 with a full bladder. I also let the patient know that all of the appt information will be in her mychart. Patient understood.

## 2024-02-08 ENCOUNTER — Ambulatory Visit
Admission: RE | Admit: 2024-02-08 | Discharge: 2024-02-08 | Disposition: A | Discharge status: Home / Self Care | Source: Ambulatory Visit | Attending: Family Medicine | Admitting: Family Medicine

## 2024-02-08 DIAGNOSIS — Z3201 Encounter for pregnancy test, result positive: Secondary | ICD-10-CM | POA: Insufficient documentation

## 2024-02-08 DIAGNOSIS — Z8759 Personal history of other complications of pregnancy, childbirth and the puerperium: Secondary | ICD-10-CM | POA: Diagnosis present

## 2024-02-09 ENCOUNTER — Ambulatory Visit: Payer: Self-pay | Admitting: Family Medicine

## 2024-02-09 NOTE — Progress Notes (Signed)
 Reviewed. IUP estimated [redacted]w[redacted]d. Given only 3 days' discordance from EGA by LMP, will keep by LMP. Reassuring to find IUP.   Dorothyann Helling, MD 02/09/24  2:19 PM

## 2024-02-16 ENCOUNTER — Other Ambulatory Visit: Payer: Self-pay

## 2024-02-16 ENCOUNTER — Emergency Department
Admission: EM | Admit: 2024-02-16 | Discharge: 2024-02-16 | Disposition: A | Discharge status: Home / Self Care | Attending: Emergency Medicine | Admitting: Emergency Medicine

## 2024-02-16 ENCOUNTER — Emergency Department

## 2024-02-16 DIAGNOSIS — S99922A Unspecified injury of left foot, initial encounter: Secondary | ICD-10-CM | POA: Diagnosis present

## 2024-02-16 DIAGNOSIS — S9032XA Contusion of left foot, initial encounter: Secondary | ICD-10-CM | POA: Insufficient documentation

## 2024-02-16 DIAGNOSIS — Y9389 Activity, other specified: Secondary | ICD-10-CM | POA: Diagnosis not present

## 2024-02-16 DIAGNOSIS — W208XXA Other cause of strike by thrown, projected or falling object, initial encounter: Secondary | ICD-10-CM | POA: Insufficient documentation

## 2024-02-16 NOTE — Discharge Instructions (Signed)
 The x-ray of your foot did not show any fractures.  Suspect that you have broken a blood vessel on your big toe.  You may notice some swelling and bruising.  Please ice and elevate your foot.  You can take Tylenol and ibuprofen  as needed for pain.

## 2024-02-16 NOTE — ED Triage Notes (Signed)
 Pt to ed from home via POV for possible foot injury. Pt dropped a piece of wood on her left foot and she now has pain in it and thinks she broke it. Pt is caox4, in no acute distress and ambulatory to triage.

## 2024-02-16 NOTE — ED Provider Notes (Signed)
 [facility] Provider Note    Event Date/Time   First MD Initiated Contact with Patient 02/16/24 2037     (approximate)   History   Foot Injury (Left x 30 mins)   HPI  Heather Newton is a 23 y.o. female with no PMH who presents for evaluation of a left great toe injury.  Patient is replacing the floors in her house and a piece of wood fell landing on her left toe.  She reports significant pain to the area and is concerned that it is broken.      Physical Exam   Triage Vital Signs: ED Triage Vitals [02/16/24 2028]  Encounter Vitals Group     BP 117/79     Girls Systolic BP Percentile      Girls Diastolic BP Percentile      Boys Systolic BP Percentile      Boys Diastolic BP Percentile      Pulse Rate 80     Resp 16     Temp 98.3 F (36.8 C)     Temp Source Oral     SpO2 100 %     Weight      Height 5' 8 (1.727 m)     Head Circumference      Peak Flow      Pain Score 10     Pain Loc      Pain Education      Exclude from Growth Chart     Most recent vital signs: Vitals:   02/16/24 2028  BP: 117/79  Pulse: 80  Resp: 16  Temp: 98.3 F (36.8 C)  SpO2: 100%   General: Awake, no distress.  CV:  Good peripheral perfusion.  Resp:  Normal effort.  Abd:  No distention.  Other:  Swelling noted to the left great toe with some bruising, very tender to palpation, dorsalis pedis pulse 2+ and regular, capillary refill appropriate, patient able to flex and extend her toe.  No bleeding under the toenail.   ED Results / Procedures / Treatments   Labs (all labs ordered are listed, but only abnormal results are displayed) Labs Reviewed - No data to display   RADIOLOGY  Left foot x-ray obtained, interpreted the images as well as reviewed the radiologist report which was negative for any fractures.  PROCEDURES:  Critical Care performed: No  Procedures   MEDICATIONS ORDERED IN ED: Medications - No data to display   IMPRESSION / MDM /  ASSESSMENT AND PLAN / ED COURSE  I reviewed the triage vital signs and the nursing notes.                             23 year old female presents for evaluation of a left great toe injury.  Vital signs are stable patient NAD on exam.  Differential diagnosis includes, but is not limited to, contusion, fracture, subungual hematoma.  Patient's presentation is most consistent with acute complicated illness / injury requiring diagnostic workup.  X-ray of the left foot is negative for fractures.  Patient does have some swelling and bruising over the left great toe, suspect there was a blood vessel ruptured when the board fell on it.  No bleeding under the toenail.  Recommended ice and elevation.  She can take Tylenol and ibuprofen  as needed.  Patient voiced understanding, all questions were answered and she was stable at discharge.   FINAL CLINICAL IMPRESSION(S) / ED DIAGNOSES  Final diagnoses:  Contusion of left foot, initial encounter     Rx / DC Orders   ED Discharge Orders     None        Note:  This document was prepared using Dragon voice recognition software and may include unintentional dictation errors.   Cleaster Tinnie DELENA DEVONNA 02/16/24 2118    Nicholaus Rolland BRAVO, MD 02/17/24 MIKI

## 2024-03-10 ENCOUNTER — Inpatient Hospital Stay (HOSPITAL_COMMUNITY)
Admission: AD | Admit: 2024-03-10 | Discharge: 2024-03-10 | Disposition: A | Discharge status: Home / Self Care | Attending: Family Medicine | Admitting: Family Medicine

## 2024-03-10 DIAGNOSIS — Z3A10 10 weeks gestation of pregnancy: Secondary | ICD-10-CM | POA: Diagnosis not present

## 2024-03-10 DIAGNOSIS — O23591 Infection of other part of genital tract in pregnancy, first trimester: Secondary | ICD-10-CM | POA: Diagnosis not present

## 2024-03-10 DIAGNOSIS — O23599 Infection of other part of genital tract in pregnancy, unspecified trimester: Secondary | ICD-10-CM

## 2024-03-10 DIAGNOSIS — B9689 Other specified bacterial agents as the cause of diseases classified elsewhere: Secondary | ICD-10-CM | POA: Diagnosis not present

## 2024-03-10 DIAGNOSIS — N898 Other specified noninflammatory disorders of vagina: Secondary | ICD-10-CM | POA: Diagnosis present

## 2024-03-10 LAB — WET PREP, GENITAL
Sperm: NONE SEEN
Trich, Wet Prep: NONE SEEN
WBC, Wet Prep HPF POC: <10 (ref <10)
Yeast Wet Prep HPF POC: NONE SEEN

## 2024-03-10 MED ORDER — METRONIDAZOLE 500 MG PO TABS: 500.0000 mg | ORAL_TABLET | Freq: Two times a day (BID) | ORAL | 0 refills | Status: AC

## 2024-03-10 NOTE — MAU Provider Note (Signed)
 None     S [name] is a 23 y.o. G38P0020 female at [redacted]w[redacted]d who presents to MAU today with complaint of bacterial vaginosis. She reports that she was diagnosed with BV at [redacted] weeks gestation and lost her prescription. She was worried and her mom told her to come in immediately. Endorses malodorous vaginal discharge, no vaginal bleeding or pain.  No prenatal care yet. MAU notes reviewed, was seen in MAU on 8/14 and diagnosed with BV. Prescription for metronidazole  500 mg BID x7 days was sent to the pharmacy.  Pertinent items noted in HPI and remainder of comprehensive ROS otherwise negative.   O BP 131/68 (BP Location: Right Arm)   Pulse 87   Temp 99.3 F (37.4 C) (Oral)   Resp 17   Ht 5' 8 (1.727 m)   Wt 126.1 kg   LMP 12/26/2023 (Approximate)   SpO2 100%   BMI 42.25 kg/m  Physical Exam Vitals reviewed.  Constitutional:      General: She is not in acute distress.    Appearance: She is well-developed. She is not diaphoretic.  Eyes:     General: No scleral icterus. Pulmonary:     Effort: Pulmonary effort is normal. No respiratory distress.  Skin:    General: Skin is warm and dry.  Neurological:     Mental Status: She is alert.     Coordination: Coordination normal.    Results for orders placed or performed during the hospital encounter of 03/10/24 (from the past 24 hours)  Wet prep, genital     Status: Abnormal   Collection Time: 03/10/24  6:27 PM   Specimen: Vaginal  Result Value Ref Range   Yeast Wet Prep HPF POC NONE SEEN NONE SEEN   Trich, Wet Prep NONE SEEN NONE SEEN   Clue Cells Wet Prep HPF POC PRESENT (A) NONE SEEN   WBC, Wet Prep HPF POC <10 <10   Sperm NONE SEEN     MDM: Moderate MAU Course: -Vital signs within normal limits. -Wet prep positive for BV.  A 1. Bacterial vaginosis in pregnancy (Primary) - Discharge patient  2. [redacted] weeks gestation of pregnancy - Discharge patient  Medical screening exam complete  P Discharge from MAU in  stable condition with bleeding precautions Follow up at [facility] as scheduled for ongoing prenatal care  No future appointments. Allergies as of 03/10/2024   No Known Allergies      Medication List     TAKE these medications    metroNIDAZOLE  500 MG tablet Commonly known as: FLAGYL  Take 1 tablet (500 mg total) by mouth 2 (two) times daily for 7 days.   multivitamin-prenatal 27-0.8 MG Tabs tablet Take 1 tablet by mouth daily at 12 noon.   triamcinolone  0.025 % ointment Commonly known as: KENALOG  Apply 1 Application topically 2 (two) times daily.        Joesph DELENA Sear, PA

## 2024-03-10 NOTE — MAU Note (Signed)
 Heather Newton is a 23 y.o. at [redacted]w[redacted]d here in MAU reporting: was here at 5 wks, was dx with BV, lost the prescription. Now she is worried about it , her mom told hr she needed to come in immediately. No pain. No bleeding.  Occ will see a few spots of d/c, but there is a definite odor-has been constant.   Onset of complaint: ongoing Pain score: none Vitals:   03/10/24 1825  BP: 131/68  Pulse: 87  Resp: 17  Temp: 99.3 F (37.4 C)  SpO2: 100%      Lab orders placed from triage:  vag swabs

## 2024-03-10 NOTE — Discharge Instructions (Signed)
 You have an overgrowth of bacteria in your vagina called bacterial vaginosis. This is not a sexually transmitted infection.  Sexual partners do not need to be treated, however abstaining from sex or using condoms may prevent recurrence of the overgrowth. Some women have a recurrence of the overgrowth even when fully treated.  Call the office if your symptoms begin again.  Do not douche as this is associated with decreased cure rates and more bacterial overgrowths. Take the full course of the antibiotic prescribed to you even if you begin to feel better. Do not drink alcohol with Flagyl  as the drug will cause an upset stomach if you do drink.

## 2024-03-11 LAB — GC/CHLAMYDIA PROBE AMP (~~LOC~~) NOT AT ARMC
Chlamydia: NEGATIVE
Comment: NEGATIVE
Comment: NORMAL
Neisseria Gonorrhea: NEGATIVE

## 2024-04-18 ENCOUNTER — Encounter: Payer: Self-pay | Admitting: Emergency Medicine

## 2024-04-18 ENCOUNTER — Other Ambulatory Visit: Payer: Self-pay

## 2024-04-18 DIAGNOSIS — O26892 Other specified pregnancy related conditions, second trimester: Secondary | ICD-10-CM | POA: Insufficient documentation

## 2024-04-18 DIAGNOSIS — J069 Acute upper respiratory infection, unspecified: Secondary | ICD-10-CM | POA: Diagnosis not present

## 2024-04-18 DIAGNOSIS — R059 Cough, unspecified: Secondary | ICD-10-CM | POA: Insufficient documentation

## 2024-04-18 DIAGNOSIS — Z3A16 16 weeks gestation of pregnancy: Secondary | ICD-10-CM | POA: Diagnosis not present

## 2024-04-18 LAB — RESP PANEL BY RT-PCR (RSV, FLU A&B, COVID)  RVPGX2
Influenza A by PCR: NEGATIVE
Influenza B by PCR: NEGATIVE
Resp Syncytial Virus by PCR: NEGATIVE
SARS Coronavirus 2 by RT PCR: NEGATIVE

## 2024-04-18 LAB — GROUP A STREP BY PCR: Group A Strep by PCR: NOT DETECTED

## 2024-04-18 NOTE — ED Triage Notes (Addendum)
 Pt arrives POV ambulatory to triage, gait steady, no acute distress noted c/o sickness of congestion, sore throat, HA for 1 week, sob worse last night, and today having bilateral ear pressure. Pt reports [redacted]w[redacted]d gestation. G-1p-0. LMP 12/26/23

## 2024-04-18 NOTE — ED Notes (Addendum)
 Attempted several times to obtain FHTs without success by this nurse and previous triage nurse; Dr Gordan notified; pt approx [redacted]wks pregnant with no pregnancy c/o voiced; no further orders given at this time

## 2024-04-19 ENCOUNTER — Emergency Department

## 2024-04-19 ENCOUNTER — Emergency Department
Admission: EM | Admit: 2024-04-19 | Discharge: 2024-04-19 | Disposition: A | Discharge status: Home / Self Care | Attending: Emergency Medicine | Admitting: Emergency Medicine

## 2024-04-19 DIAGNOSIS — J069 Acute upper respiratory infection, unspecified: Secondary | ICD-10-CM

## 2024-04-19 LAB — CBC
HCT: 34.6 % — ABNORMAL LOW (ref 36.0–46.0)
Hemoglobin: 11.5 g/dL — ABNORMAL LOW (ref 12.0–15.0)
MCH: 28 pg (ref 26.0–34.0)
MCHC: 33.2 g/dL (ref 30.0–36.0)
MCV: 84.4 fL (ref 80.0–100.0)
Platelets: 306 K/uL (ref 150–400)
RBC: 4.1 MIL/uL (ref 3.87–5.11)
RDW: 13.1 % (ref 11.5–15.5)
WBC: 10.3 K/uL (ref 4.0–10.5)
nRBC: 0 % (ref 0.0–0.2)

## 2024-04-19 LAB — URINALYSIS, ROUTINE W REFLEX MICROSCOPIC
Bilirubin Urine: NEGATIVE
Glucose, UA: NEGATIVE mg/dL
Hgb urine dipstick: NEGATIVE
Ketones, ur: NEGATIVE mg/dL
Leukocytes,Ua: NEGATIVE
Nitrite: NEGATIVE
Protein, ur: NEGATIVE mg/dL
Specific Gravity, Urine: 1.027 (ref 1.005–1.030)
pH: 5 (ref 5.0–8.0)

## 2024-04-19 LAB — POC URINE PREG, ED
Preg Test, Ur: POSITIVE — AB
Preg Test, Ur: NEGATIVE
Preg Test, Ur: POSITIVE — AB

## 2024-04-19 LAB — HCG, QUANTITATIVE, PREGNANCY: hCG, Beta Chain, Quant, S: 19840 m[IU]/mL — ABNORMAL HIGH (ref <5)

## 2024-04-19 LAB — BASIC METABOLIC PANEL WITH GFR
Anion gap: 10 (ref 5–15)
BUN: 8 mg/dL (ref 6–20)
CO2: 23 mmol/L (ref 22–32)
Calcium: 9.1 mg/dL (ref 8.9–10.3)
Chloride: 104 mmol/L (ref 98–111)
Creatinine, Ser: 0.56 mg/dL (ref 0.44–1.00)
GFR, Estimated: >60 mL/min (ref >60)
Glucose, Bld: 91 mg/dL (ref 70–99)
Potassium: 3.8 mmol/L (ref 3.5–5.1)
Sodium: 137 mmol/L (ref 135–145)

## 2024-04-19 LAB — TROPONIN I (HIGH SENSITIVITY)
Troponin I (High Sensitivity): <2 ng/L (ref <18)
Troponin I (High Sensitivity): <2 ng/L (ref <18)

## 2024-04-19 NOTE — Discharge Instructions (Signed)
 Take Tylenol 650 mg every 6 hours as needed for pain.  For your nasal congestion, try sinus rinse that you can find at your local pharmacy.  In terms of your sinus pressure and pain, if it exceeds 10 days in duration or begins to result in a fever, talk to your doctor about antibiotics for bacterial sinusitis.  At this time it does not appear to be a bacterial infection and so antibiotics will not be helpful.  Thank you for choosing us  for your health care today!  Please see your primary doctor this week for a follow up appointment.   If you have any new, worsening, or unexpected symptoms call your doctor right away or come back to the emergency department for reevaluation.  It was my pleasure to care for you today.   Ginnie EDISON Cyrena, MD

## 2024-04-19 NOTE — ED Notes (Addendum)
 Pt approached nursing desk in [facility] and reports her chest is really tight and is getting really hard for her to breath; no acute distress noted. Pt ambulatory to triage bay, gait steady. EKG being performed and blood work to obtain per triage standing orders. Pt also reports abd cramping that started 15 minutes ago.

## 2024-04-19 NOTE — ED Provider Notes (Signed)
 [facility] Provider Note    Event Date/Time   First MD Initiated Contact with Patient 04/19/24 0400     (approximate)   History   Sore Throat and Otalgia   HPI  Heather Newton is a 23 y.o. female   Past medical history of no significant health problems who is approximately [redacted] weeks pregnant who presents emergency department with just under 1 week of nasal congestion, sinus pressure, bilateral ear pressure, sore throat, dry cough.  No fever.  No abdominal pain, gush of fluids, vaginal discharge or vaginal bleeding.  No urinary symptoms.  No known sick contacts.  No neck pain or stiffness.  No other acute medical complaints.    External Medical Documents Reviewed: Previous outpatient notes      Physical Exam   Triage Vital Signs: ED Triage Vitals [04/18/24 2246]  Encounter Vitals Group     BP 131/74     Girls Systolic BP Percentile      Girls Diastolic BP Percentile      Boys Systolic BP Percentile      Boys Diastolic BP Percentile      Pulse Rate 94     Resp 18     Temp 98.9 F (37.2 C)     Temp Source Oral     SpO2 100 %     Weight      Height      Head Circumference      Peak Flow      Pain Score      Pain Loc      Pain Education      Exclude from Growth Chart     Most recent vital signs: Vitals:   04/18/24 2246 04/19/24 0323  BP: 131/74 122/64  Pulse: 94 91  Resp: 18 18  Temp: 98.9 F (37.2 C) 98.5 F (36.9 C)  SpO2: 100% 99%    General: Awake, no distress.  CV:  Good peripheral perfusion.  Resp:  Normal effort.  Abd:  No distention.  Other:  Well-appearing woman in no acute distress with normal vital signs.  Afebrile.  Neck supple full range of motion.  Posterior oropharynx without lesions exudates or unilateral swelling, uvula midline, airway very much intact.  No cervical lymphadenopathy.  Bilateral TMs inspected no signs of infection.  Soft benign abdominal exam.  Clear lungs to auscultation without focality or  wheezing.   ED Results / Procedures / Treatments   Labs (all labs ordered are listed, but only abnormal results are displayed) Labs Reviewed  CBC - Abnormal; Notable for the following components:      Result Value   Hemoglobin 11.5 (*)    HCT 34.6 (*)    All other components within normal limits  HCG, QUANTITATIVE, PREGNANCY - Abnormal; Notable for the following components:   hCG, Beta Chain, Quant, S 19,840 (*)    All other components within normal limits  URINALYSIS, ROUTINE W REFLEX MICROSCOPIC - Abnormal; Notable for the following components:   Color, Urine YELLOW (*)    APPearance CLOUDY (*)    All other components within normal limits  POC URINE PREG, ED - Abnormal; Notable for the following components:   Preg Test, Ur Positive (*)    All other components within normal limits  RESP PANEL BY RT-PCR (RSV, FLU A&B, COVID)  RVPGX2  GROUP A STREP BY PCR  BASIC METABOLIC PANEL WITH GFR  POC URINE PREG, ED  TROPONIN I (HIGH SENSITIVITY)  TROPONIN I (HIGH  SENSITIVITY)     I ordered and reviewed the above labs they are notable for no leukocytosis, mild anemia.  EKG  ED ECG REPORT I, Ginnie Shams, the attending physician, personally viewed and interpreted this ECG.   Date: 04/19/2024  EKG Time: 0201  Rate: 89  Rhythm: sinus  Axis: nl  Intervals:nl  ST&T Change: no stemi    RADIOLOGY I independently reviewed and interpreted OB ultrasound and see intrauterine pregnancy. I also reviewed radiologist's formal read.   PROCEDURES:  Critical Care performed: No  Procedures   MEDICATIONS ORDERED IN ED: Medications - No data to display   IMPRESSION / MDM / ASSESSMENT AND PLAN / ED COURSE  I reviewed the triage vital signs and the nursing notes.                                Patient's presentation is most consistent with acute presentation with potential threat to life or bodily function.  Differential diagnosis includes, but is not limited to, viral URI,  bacterial pneumonia, acute otitis media, viral sinusitis versus bacterial sinusitis, deep space neck infection or abscess, meningitis or sepsis   The patient is on the cardiac monitor to evaluate for evidence of arrhythmia and/or significant heart rate changes.  MDM:    Pregnant patient with viral URI with cough.  Sinusitis as well, likely viral, as it is inside of 7 days, no fever, nontoxic appearance I will defer antibiotics.  No evidence of ear infection.  Clear lungs doubt pneumonia.  Negative viral testing and strep testing.  Benign appearing posterior oropharynx with no significant risk factors for oral gonorrhea/chlamydia, deferred testing.  Does not appear septic, not meningitic, no indication for hospitalization at this time.  Anticipatory guidance given, discharged with close PMD/gynecology follow-up.         FINAL CLINICAL IMPRESSION(S) / ED DIAGNOSES   Final diagnoses:  Viral URI with cough     Rx / DC Orders   ED Discharge Orders     None        Note:  This document was prepared using Dragon voice recognition software and may include unintentional dictation errors.    Shams Ginnie, MD 04/19/24 [phone]

## 2024-05-22 NOTE — Telephone Encounter (Signed)
 Orie is calling in again to check on status of this Ultrasound report being updated and faxed back to them with Placenta location updated.   Please fax to: [phone]

## 2024-05-22 NOTE — Telephone Encounter (Signed)
 Spoke with Dr. Ethyl who is in [place] today at 9:25am and requested that he update the report so we can resend via fax. MD to update report.

## 2024-05-28 NOTE — Telephone Encounter (Signed)
 Fax to direct # [phone] Orie with [facility] calling to get the updated US  report faxed to her with the Placenta location on the report. Currently it is not mentioned and she needs this for her report.

## 2024-05-29 NOTE — Telephone Encounter (Signed)
 U/S report sent via RightFax.

## 2024-07-07 ENCOUNTER — Observation Stay
Admission: EM | Admit: 2024-07-07 | Discharge: 2024-07-08 | Disposition: A | Attending: Obstetrics and Gynecology | Admitting: Obstetrics and Gynecology

## 2024-07-07 DIAGNOSIS — O368120 Decreased fetal movements, second trimester, not applicable or unspecified: Principal | ICD-10-CM | POA: Insufficient documentation

## 2024-07-07 DIAGNOSIS — S8002XA Contusion of left knee, initial encounter: Secondary | ICD-10-CM | POA: Insufficient documentation

## 2024-07-07 DIAGNOSIS — M545 Low back pain, unspecified: Secondary | ICD-10-CM | POA: Diagnosis not present

## 2024-07-07 DIAGNOSIS — Z3A27 27 weeks gestation of pregnancy: Secondary | ICD-10-CM | POA: Diagnosis not present

## 2024-07-07 DIAGNOSIS — O26892 Other specified pregnancy related conditions, second trimester: Secondary | ICD-10-CM | POA: Insufficient documentation

## 2024-07-07 DIAGNOSIS — O9A212 Injury, poisoning and certain other consequences of external causes complicating pregnancy, second trimester: Secondary | ICD-10-CM | POA: Insufficient documentation

## 2024-07-07 DIAGNOSIS — O368190 Decreased fetal movements, unspecified trimester, not applicable or unspecified: Principal | ICD-10-CM | POA: Diagnosis present

## 2024-07-08 ENCOUNTER — Encounter: Payer: Self-pay | Admitting: Emergency Medicine

## 2024-07-08 ENCOUNTER — Inpatient Hospital Stay
Admission: EM | Admit: 2024-07-08 | Discharge: 2024-07-08 | Disposition: A | Discharge status: Home / Self Care | Source: Home / Self Care | Attending: Emergency Medicine | Admitting: Emergency Medicine

## 2024-07-08 ENCOUNTER — Other Ambulatory Visit: Payer: Self-pay

## 2024-07-08 DIAGNOSIS — O26892 Other specified pregnancy related conditions, second trimester: Secondary | ICD-10-CM | POA: Insufficient documentation

## 2024-07-08 DIAGNOSIS — O9A212 Injury, poisoning and certain other consequences of external causes complicating pregnancy, second trimester: Secondary | ICD-10-CM | POA: Insufficient documentation

## 2024-07-08 DIAGNOSIS — Z3A27 27 weeks gestation of pregnancy: Secondary | ICD-10-CM | POA: Insufficient documentation

## 2024-07-08 DIAGNOSIS — M545 Low back pain, unspecified: Secondary | ICD-10-CM | POA: Insufficient documentation

## 2024-07-08 DIAGNOSIS — O368190 Decreased fetal movements, unspecified trimester, not applicable or unspecified: Principal | ICD-10-CM | POA: Diagnosis present

## 2024-07-08 DIAGNOSIS — S8002XA Contusion of left knee, initial encounter: Secondary | ICD-10-CM | POA: Insufficient documentation

## 2024-07-08 DIAGNOSIS — S8992XA Unspecified injury of left lower leg, initial encounter: Secondary | ICD-10-CM
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
## 2024-07-08 NOTE — OB Triage Note (Signed)
 9650- Patient returned from ED to L&D
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
